# Patient Record
Sex: Female | Born: 1935 | Race: White | Hispanic: No | Smoking: Never smoker
Health system: Southern US, Community
[De-identification: ages and names within clinical notes are randomized; demographics above are authoritative.]

## PROBLEM LIST (undated history)

## (undated) DIAGNOSIS — M545 Low back pain, unspecified: Secondary | ICD-10-CM

## (undated) DIAGNOSIS — I639 Cerebral infarction, unspecified: Secondary | ICD-10-CM

## (undated) DIAGNOSIS — E782 Mixed hyperlipidemia: Secondary | ICD-10-CM

## (undated) DIAGNOSIS — E034 Atrophy of thyroid (acquired): Secondary | ICD-10-CM

## (undated) DIAGNOSIS — M1990 Unspecified osteoarthritis, unspecified site: Secondary | ICD-10-CM

## (undated) DIAGNOSIS — H47012 Ischemic optic neuropathy, left eye: Secondary | ICD-10-CM

## (undated) DIAGNOSIS — Z85828 Personal history of other malignant neoplasm of skin: Secondary | ICD-10-CM

## (undated) DIAGNOSIS — K219 Gastro-esophageal reflux disease without esophagitis: Secondary | ICD-10-CM

## (undated) DIAGNOSIS — E559 Vitamin D deficiency, unspecified: Secondary | ICD-10-CM

## (undated) DIAGNOSIS — I10 Essential (primary) hypertension: Secondary | ICD-10-CM

## (undated) HISTORY — DX: Atrophy of thyroid (acquired): E03.4

## (undated) HISTORY — PX: SALPINGOOPHORECTOMY: SHX82

## (undated) HISTORY — DX: Gastro-esophageal reflux disease without esophagitis: K21.9

## (undated) HISTORY — DX: Mixed hyperlipidemia: E78.2

## (undated) HISTORY — DX: Ischemic optic neuropathy, left eye: H47.012

## (undated) HISTORY — PX: THYROIDECTOMY: SHX17

## (undated) HISTORY — DX: Cerebral infarction, unspecified: I63.9

## (undated) HISTORY — DX: Low back pain, unspecified: M54.50

## (undated) HISTORY — DX: Unspecified osteoarthritis, unspecified site: M19.90

## (undated) HISTORY — DX: Essential (primary) hypertension: I10

## (undated) HISTORY — PX: ABDOMINAL HYSTERECTOMY: SHX81

## (undated) HISTORY — DX: Vitamin D deficiency, unspecified: E55.9

## (undated) HISTORY — DX: Personal history of other malignant neoplasm of skin: Z85.828

---
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
## 1998-05-20 ENCOUNTER — Ambulatory Visit (HOSPITAL_COMMUNITY): Admission: RE | Admit: 1998-05-20 | Discharge: 1998-05-20 | Payer: Self-pay | Admitting: Oncology

## 2003-05-25 ENCOUNTER — Ambulatory Visit (HOSPITAL_COMMUNITY): Admission: RE | Admit: 2003-05-25 | Discharge: 2003-05-25 | Payer: Self-pay | Admitting: *Deleted

## 2003-05-25 ENCOUNTER — Encounter: Payer: Self-pay | Admitting: *Deleted

## 2004-12-26 ENCOUNTER — Observation Stay (HOSPITAL_COMMUNITY): Admission: RE | Admit: 2004-12-26 | Discharge: 2004-12-27 | Payer: Self-pay | Admitting: Surgery

## 2006-01-06 ENCOUNTER — Encounter: Payer: Self-pay | Admitting: Oncology

## 2013-09-21 DIAGNOSIS — M5412 Radiculopathy, cervical region: Secondary | ICD-10-CM | POA: Diagnosis not present

## 2013-09-21 DIAGNOSIS — M67919 Unspecified disorder of synovium and tendon, unspecified shoulder: Secondary | ICD-10-CM | POA: Diagnosis not present

## 2013-09-21 DIAGNOSIS — M25519 Pain in unspecified shoulder: Secondary | ICD-10-CM | POA: Diagnosis not present

## 2013-10-02 DIAGNOSIS — H26499 Other secondary cataract, unspecified eye: Secondary | ICD-10-CM | POA: Diagnosis not present

## 2013-10-05 DIAGNOSIS — I889 Nonspecific lymphadenitis, unspecified: Secondary | ICD-10-CM | POA: Diagnosis not present

## 2013-10-11 DIAGNOSIS — R220 Localized swelling, mass and lump, head: Secondary | ICD-10-CM | POA: Diagnosis not present

## 2013-10-11 DIAGNOSIS — R221 Localized swelling, mass and lump, neck: Secondary | ICD-10-CM | POA: Diagnosis not present

## 2013-10-11 DIAGNOSIS — I889 Nonspecific lymphadenitis, unspecified: Secondary | ICD-10-CM | POA: Diagnosis not present

## 2013-12-06 LAB — HM DEXA SCAN

## 2013-12-13 DIAGNOSIS — R059 Cough, unspecified: Secondary | ICD-10-CM | POA: Diagnosis not present

## 2013-12-13 DIAGNOSIS — E038 Other specified hypothyroidism: Secondary | ICD-10-CM | POA: Diagnosis not present

## 2013-12-13 DIAGNOSIS — E7800 Pure hypercholesterolemia, unspecified: Secondary | ICD-10-CM | POA: Diagnosis not present

## 2013-12-13 DIAGNOSIS — R05 Cough: Secondary | ICD-10-CM | POA: Diagnosis not present

## 2013-12-13 DIAGNOSIS — E782 Mixed hyperlipidemia: Secondary | ICD-10-CM | POA: Diagnosis not present

## 2013-12-13 DIAGNOSIS — Z1331 Encounter for screening for depression: Secondary | ICD-10-CM | POA: Diagnosis not present

## 2013-12-13 DIAGNOSIS — I10 Essential (primary) hypertension: Secondary | ICD-10-CM | POA: Diagnosis not present

## 2013-12-13 DIAGNOSIS — Z1239 Encounter for other screening for malignant neoplasm of breast: Secondary | ICD-10-CM | POA: Diagnosis not present

## 2013-12-13 DIAGNOSIS — M818 Other osteoporosis without current pathological fracture: Secondary | ICD-10-CM | POA: Diagnosis not present

## 2013-12-25 DIAGNOSIS — M810 Age-related osteoporosis without current pathological fracture: Secondary | ICD-10-CM | POA: Diagnosis not present

## 2013-12-25 DIAGNOSIS — M818 Other osteoporosis without current pathological fracture: Secondary | ICD-10-CM | POA: Diagnosis not present

## 2013-12-25 DIAGNOSIS — Z1231 Encounter for screening mammogram for malignant neoplasm of breast: Secondary | ICD-10-CM | POA: Diagnosis not present

## 2014-01-09 DIAGNOSIS — L570 Actinic keratosis: Secondary | ICD-10-CM | POA: Diagnosis not present

## 2014-01-09 DIAGNOSIS — L819 Disorder of pigmentation, unspecified: Secondary | ICD-10-CM | POA: Diagnosis not present

## 2014-01-09 DIAGNOSIS — L723 Sebaceous cyst: Secondary | ICD-10-CM | POA: Diagnosis not present

## 2014-02-12 DIAGNOSIS — Z7902 Long term (current) use of antithrombotics/antiplatelets: Secondary | ICD-10-CM | POA: Diagnosis not present

## 2014-02-12 DIAGNOSIS — J440 Chronic obstructive pulmonary disease with acute lower respiratory infection: Secondary | ICD-10-CM | POA: Diagnosis not present

## 2014-02-12 DIAGNOSIS — R5383 Other fatigue: Secondary | ICD-10-CM | POA: Diagnosis not present

## 2014-02-12 DIAGNOSIS — Z8673 Personal history of transient ischemic attack (TIA), and cerebral infarction without residual deficits: Secondary | ICD-10-CM | POA: Diagnosis not present

## 2014-02-12 DIAGNOSIS — R5381 Other malaise: Secondary | ICD-10-CM | POA: Diagnosis not present

## 2014-02-12 DIAGNOSIS — J449 Chronic obstructive pulmonary disease, unspecified: Secondary | ICD-10-CM | POA: Diagnosis not present

## 2014-02-12 DIAGNOSIS — R002 Palpitations: Secondary | ICD-10-CM | POA: Diagnosis not present

## 2014-02-12 DIAGNOSIS — J209 Acute bronchitis, unspecified: Secondary | ICD-10-CM | POA: Diagnosis not present

## 2014-02-16 DIAGNOSIS — J209 Acute bronchitis, unspecified: Secondary | ICD-10-CM | POA: Diagnosis not present

## 2014-03-14 DIAGNOSIS — K591 Functional diarrhea: Secondary | ICD-10-CM | POA: Diagnosis not present

## 2014-03-14 DIAGNOSIS — R1310 Dysphagia, unspecified: Secondary | ICD-10-CM | POA: Diagnosis not present

## 2014-03-14 DIAGNOSIS — K644 Residual hemorrhoidal skin tags: Secondary | ICD-10-CM | POA: Diagnosis not present

## 2014-03-14 DIAGNOSIS — K2100 Gastro-esophageal reflux disease with esophagitis, without bleeding: Secondary | ICD-10-CM | POA: Diagnosis not present

## 2014-03-20 DIAGNOSIS — E559 Vitamin D deficiency, unspecified: Secondary | ICD-10-CM | POA: Diagnosis not present

## 2014-03-20 DIAGNOSIS — K219 Gastro-esophageal reflux disease without esophagitis: Secondary | ICD-10-CM | POA: Diagnosis not present

## 2014-03-20 DIAGNOSIS — E7800 Pure hypercholesterolemia, unspecified: Secondary | ICD-10-CM | POA: Diagnosis not present

## 2014-03-20 DIAGNOSIS — I6990 Unspecified sequelae of unspecified cerebrovascular disease: Secondary | ICD-10-CM | POA: Diagnosis not present

## 2014-03-20 DIAGNOSIS — Z79899 Other long term (current) drug therapy: Secondary | ICD-10-CM | POA: Diagnosis not present

## 2014-03-20 DIAGNOSIS — I10 Essential (primary) hypertension: Secondary | ICD-10-CM | POA: Diagnosis not present

## 2014-03-20 DIAGNOSIS — E038 Other specified hypothyroidism: Secondary | ICD-10-CM | POA: Diagnosis not present

## 2014-04-03 DIAGNOSIS — H4720 Unspecified optic atrophy: Secondary | ICD-10-CM | POA: Diagnosis not present

## 2014-04-04 DIAGNOSIS — K591 Functional diarrhea: Secondary | ICD-10-CM | POA: Diagnosis not present

## 2014-04-04 DIAGNOSIS — K2100 Gastro-esophageal reflux disease with esophagitis, without bleeding: Secondary | ICD-10-CM | POA: Diagnosis not present

## 2014-04-04 DIAGNOSIS — K645 Perianal venous thrombosis: Secondary | ICD-10-CM | POA: Diagnosis not present

## 2014-04-04 DIAGNOSIS — R1310 Dysphagia, unspecified: Secondary | ICD-10-CM | POA: Diagnosis not present

## 2014-04-17 DIAGNOSIS — R002 Palpitations: Secondary | ICD-10-CM | POA: Diagnosis not present

## 2014-04-17 DIAGNOSIS — I498 Other specified cardiac arrhythmias: Secondary | ICD-10-CM | POA: Diagnosis not present

## 2014-04-17 DIAGNOSIS — R079 Chest pain, unspecified: Secondary | ICD-10-CM | POA: Diagnosis not present

## 2014-04-17 DIAGNOSIS — R0789 Other chest pain: Secondary | ICD-10-CM | POA: Diagnosis not present

## 2014-06-07 DIAGNOSIS — J0100 Acute maxillary sinusitis, unspecified: Secondary | ICD-10-CM | POA: Diagnosis not present

## 2014-06-11 DIAGNOSIS — I493 Ventricular premature depolarization: Secondary | ICD-10-CM | POA: Diagnosis not present

## 2014-06-14 DIAGNOSIS — R002 Palpitations: Secondary | ICD-10-CM | POA: Diagnosis not present

## 2014-07-10 DIAGNOSIS — R194 Change in bowel habit: Secondary | ICD-10-CM | POA: Diagnosis not present

## 2014-07-10 DIAGNOSIS — I10 Essential (primary) hypertension: Secondary | ICD-10-CM | POA: Diagnosis not present

## 2014-07-10 DIAGNOSIS — R05 Cough: Secondary | ICD-10-CM | POA: Diagnosis not present

## 2014-07-10 DIAGNOSIS — R10814 Left lower quadrant abdominal tenderness: Secondary | ICD-10-CM | POA: Diagnosis not present

## 2014-07-10 DIAGNOSIS — Z23 Encounter for immunization: Secondary | ICD-10-CM | POA: Diagnosis not present

## 2014-07-10 DIAGNOSIS — J028 Acute pharyngitis due to other specified organisms: Secondary | ICD-10-CM | POA: Diagnosis not present

## 2014-07-16 DIAGNOSIS — M7552 Bursitis of left shoulder: Secondary | ICD-10-CM | POA: Diagnosis not present

## 2014-08-09 DIAGNOSIS — K210 Gastro-esophageal reflux disease with esophagitis: Secondary | ICD-10-CM | POA: Diagnosis not present

## 2014-08-09 DIAGNOSIS — K219 Gastro-esophageal reflux disease without esophagitis: Secondary | ICD-10-CM | POA: Diagnosis not present

## 2014-08-09 DIAGNOSIS — K591 Functional diarrhea: Secondary | ICD-10-CM | POA: Diagnosis not present

## 2014-08-09 DIAGNOSIS — R1310 Dysphagia, unspecified: Secondary | ICD-10-CM | POA: Diagnosis not present

## 2014-08-28 DIAGNOSIS — K5730 Diverticulosis of large intestine without perforation or abscess without bleeding: Secondary | ICD-10-CM | POA: Diagnosis not present

## 2014-08-28 DIAGNOSIS — Z91018 Allergy to other foods: Secondary | ICD-10-CM | POA: Diagnosis not present

## 2014-08-28 DIAGNOSIS — R634 Abnormal weight loss: Secondary | ICD-10-CM | POA: Diagnosis not present

## 2014-08-28 DIAGNOSIS — D126 Benign neoplasm of colon, unspecified: Secondary | ICD-10-CM | POA: Diagnosis not present

## 2014-08-28 DIAGNOSIS — J439 Emphysema, unspecified: Secondary | ICD-10-CM | POA: Diagnosis not present

## 2014-08-28 DIAGNOSIS — Z79899 Other long term (current) drug therapy: Secondary | ICD-10-CM | POA: Diagnosis not present

## 2014-08-28 DIAGNOSIS — K449 Diaphragmatic hernia without obstruction or gangrene: Secondary | ICD-10-CM | POA: Diagnosis not present

## 2014-08-28 DIAGNOSIS — D124 Benign neoplasm of descending colon: Secondary | ICD-10-CM | POA: Diagnosis not present

## 2014-08-28 DIAGNOSIS — R1310 Dysphagia, unspecified: Secondary | ICD-10-CM | POA: Diagnosis not present

## 2014-08-28 DIAGNOSIS — K219 Gastro-esophageal reflux disease without esophagitis: Secondary | ICD-10-CM | POA: Diagnosis not present

## 2014-08-28 DIAGNOSIS — K644 Residual hemorrhoidal skin tags: Secondary | ICD-10-CM | POA: Diagnosis not present

## 2014-08-28 DIAGNOSIS — K222 Esophageal obstruction: Secondary | ICD-10-CM | POA: Diagnosis not present

## 2014-08-28 DIAGNOSIS — K228 Other specified diseases of esophagus: Secondary | ICD-10-CM | POA: Diagnosis not present

## 2014-08-28 DIAGNOSIS — D125 Benign neoplasm of sigmoid colon: Secondary | ICD-10-CM | POA: Diagnosis not present

## 2014-09-10 DIAGNOSIS — H9201 Otalgia, right ear: Secondary | ICD-10-CM | POA: Diagnosis not present

## 2014-09-10 DIAGNOSIS — M25473 Effusion, unspecified ankle: Secondary | ICD-10-CM | POA: Diagnosis not present

## 2014-09-10 DIAGNOSIS — M25476 Effusion, unspecified foot: Secondary | ICD-10-CM | POA: Diagnosis not present

## 2014-09-10 DIAGNOSIS — E780 Pure hypercholesterolemia: Secondary | ICD-10-CM | POA: Diagnosis not present

## 2014-09-10 DIAGNOSIS — I10 Essential (primary) hypertension: Secondary | ICD-10-CM | POA: Diagnosis not present

## 2014-10-09 DIAGNOSIS — H4720 Unspecified optic atrophy: Secondary | ICD-10-CM | POA: Diagnosis not present

## 2014-10-10 DIAGNOSIS — R05 Cough: Secondary | ICD-10-CM | POA: Diagnosis not present

## 2014-10-10 DIAGNOSIS — K219 Gastro-esophageal reflux disease without esophagitis: Secondary | ICD-10-CM | POA: Diagnosis not present

## 2014-10-10 DIAGNOSIS — I10 Essential (primary) hypertension: Secondary | ICD-10-CM | POA: Diagnosis not present

## 2014-10-10 DIAGNOSIS — E559 Vitamin D deficiency, unspecified: Secondary | ICD-10-CM | POA: Diagnosis not present

## 2014-10-10 DIAGNOSIS — I6930 Unspecified sequelae of cerebral infarction: Secondary | ICD-10-CM | POA: Diagnosis not present

## 2014-10-10 DIAGNOSIS — E038 Other specified hypothyroidism: Secondary | ICD-10-CM | POA: Diagnosis not present

## 2014-10-10 DIAGNOSIS — E780 Pure hypercholesterolemia: Secondary | ICD-10-CM | POA: Diagnosis not present

## 2014-10-10 DIAGNOSIS — E034 Atrophy of thyroid (acquired): Secondary | ICD-10-CM | POA: Diagnosis not present

## 2014-10-15 DIAGNOSIS — R1310 Dysphagia, unspecified: Secondary | ICD-10-CM | POA: Diagnosis not present

## 2014-10-15 DIAGNOSIS — D126 Benign neoplasm of colon, unspecified: Secondary | ICD-10-CM | POA: Diagnosis not present

## 2014-10-15 DIAGNOSIS — K219 Gastro-esophageal reflux disease without esophagitis: Secondary | ICD-10-CM | POA: Diagnosis not present

## 2014-11-08 DIAGNOSIS — L570 Actinic keratosis: Secondary | ICD-10-CM | POA: Diagnosis not present

## 2014-11-08 DIAGNOSIS — L821 Other seborrheic keratosis: Secondary | ICD-10-CM | POA: Diagnosis not present

## 2015-01-14 DIAGNOSIS — Z1231 Encounter for screening mammogram for malignant neoplasm of breast: Secondary | ICD-10-CM | POA: Diagnosis not present

## 2015-01-15 DIAGNOSIS — E559 Vitamin D deficiency, unspecified: Secondary | ICD-10-CM | POA: Diagnosis not present

## 2015-01-15 DIAGNOSIS — I6930 Unspecified sequelae of cerebral infarction: Secondary | ICD-10-CM | POA: Diagnosis not present

## 2015-01-15 DIAGNOSIS — I10 Essential (primary) hypertension: Secondary | ICD-10-CM | POA: Diagnosis not present

## 2015-01-15 DIAGNOSIS — E780 Pure hypercholesterolemia: Secondary | ICD-10-CM | POA: Diagnosis not present

## 2015-01-15 DIAGNOSIS — R05 Cough: Secondary | ICD-10-CM | POA: Diagnosis not present

## 2015-01-15 DIAGNOSIS — K219 Gastro-esophageal reflux disease without esophagitis: Secondary | ICD-10-CM | POA: Diagnosis not present

## 2015-01-18 DIAGNOSIS — B029 Zoster without complications: Secondary | ICD-10-CM | POA: Diagnosis not present

## 2015-03-14 DIAGNOSIS — M79672 Pain in left foot: Secondary | ICD-10-CM | POA: Diagnosis not present

## 2015-04-04 DIAGNOSIS — M7552 Bursitis of left shoulder: Secondary | ICD-10-CM | POA: Diagnosis not present

## 2015-04-09 DIAGNOSIS — H4720 Unspecified optic atrophy: Secondary | ICD-10-CM | POA: Diagnosis not present

## 2015-04-15 DIAGNOSIS — M7522 Bicipital tendinitis, left shoulder: Secondary | ICD-10-CM | POA: Diagnosis not present

## 2015-04-15 DIAGNOSIS — M7552 Bursitis of left shoulder: Secondary | ICD-10-CM | POA: Diagnosis not present

## 2015-04-25 DIAGNOSIS — Z23 Encounter for immunization: Secondary | ICD-10-CM | POA: Diagnosis not present

## 2015-04-25 DIAGNOSIS — E780 Pure hypercholesterolemia: Secondary | ICD-10-CM | POA: Diagnosis not present

## 2015-04-25 DIAGNOSIS — I10 Essential (primary) hypertension: Secondary | ICD-10-CM | POA: Diagnosis not present

## 2015-04-25 DIAGNOSIS — I6930 Unspecified sequelae of cerebral infarction: Secondary | ICD-10-CM | POA: Diagnosis not present

## 2015-04-25 DIAGNOSIS — K219 Gastro-esophageal reflux disease without esophagitis: Secondary | ICD-10-CM | POA: Diagnosis not present

## 2015-04-25 DIAGNOSIS — E038 Other specified hypothyroidism: Secondary | ICD-10-CM | POA: Diagnosis not present

## 2015-05-13 DIAGNOSIS — S8001XA Contusion of right knee, initial encounter: Secondary | ICD-10-CM | POA: Diagnosis not present

## 2015-05-23 DIAGNOSIS — S51811A Laceration without foreign body of right forearm, initial encounter: Secondary | ICD-10-CM | POA: Diagnosis not present

## 2015-05-23 DIAGNOSIS — W2203XA Walked into furniture, initial encounter: Secondary | ICD-10-CM | POA: Diagnosis not present

## 2015-05-30 DIAGNOSIS — Z23 Encounter for immunization: Secondary | ICD-10-CM | POA: Diagnosis not present

## 2015-05-30 DIAGNOSIS — Z0000 Encounter for general adult medical examination without abnormal findings: Secondary | ICD-10-CM | POA: Diagnosis not present

## 2015-07-09 DIAGNOSIS — L578 Other skin changes due to chronic exposure to nonionizing radiation: Secondary | ICD-10-CM | POA: Diagnosis not present

## 2015-07-09 DIAGNOSIS — L821 Other seborrheic keratosis: Secondary | ICD-10-CM | POA: Diagnosis not present

## 2015-07-09 DIAGNOSIS — L570 Actinic keratosis: Secondary | ICD-10-CM | POA: Diagnosis not present

## 2015-07-09 DIAGNOSIS — L820 Inflamed seborrheic keratosis: Secondary | ICD-10-CM | POA: Diagnosis not present

## 2015-08-05 DIAGNOSIS — E782 Mixed hyperlipidemia: Secondary | ICD-10-CM | POA: Diagnosis not present

## 2015-08-05 DIAGNOSIS — E784 Other hyperlipidemia: Secondary | ICD-10-CM | POA: Diagnosis not present

## 2015-08-05 DIAGNOSIS — E034 Atrophy of thyroid (acquired): Secondary | ICD-10-CM | POA: Diagnosis not present

## 2015-08-05 DIAGNOSIS — R10814 Left lower quadrant abdominal tenderness: Secondary | ICD-10-CM | POA: Diagnosis not present

## 2015-08-05 DIAGNOSIS — I10 Essential (primary) hypertension: Secondary | ICD-10-CM | POA: Diagnosis not present

## 2015-08-05 DIAGNOSIS — R002 Palpitations: Secondary | ICD-10-CM | POA: Diagnosis not present

## 2015-08-05 DIAGNOSIS — K219 Gastro-esophageal reflux disease without esophagitis: Secondary | ICD-10-CM | POA: Diagnosis not present

## 2015-08-05 DIAGNOSIS — R05 Cough: Secondary | ICD-10-CM | POA: Diagnosis not present

## 2015-08-05 DIAGNOSIS — I6930 Unspecified sequelae of cerebral infarction: Secondary | ICD-10-CM | POA: Diagnosis not present

## 2015-08-14 DIAGNOSIS — M7522 Bicipital tendinitis, left shoulder: Secondary | ICD-10-CM | POA: Diagnosis not present

## 2015-08-15 DIAGNOSIS — R1032 Left lower quadrant pain: Secondary | ICD-10-CM | POA: Diagnosis not present

## 2015-08-15 DIAGNOSIS — K5730 Diverticulosis of large intestine without perforation or abscess without bleeding: Secondary | ICD-10-CM | POA: Diagnosis not present

## 2015-08-15 DIAGNOSIS — I517 Cardiomegaly: Secondary | ICD-10-CM | POA: Diagnosis not present

## 2015-08-26 DIAGNOSIS — M19012 Primary osteoarthritis, left shoulder: Secondary | ICD-10-CM | POA: Diagnosis not present

## 2015-08-26 DIAGNOSIS — M25512 Pain in left shoulder: Secondary | ICD-10-CM | POA: Diagnosis not present

## 2015-08-26 DIAGNOSIS — M75102 Unspecified rotator cuff tear or rupture of left shoulder, not specified as traumatic: Secondary | ICD-10-CM | POA: Diagnosis not present

## 2015-08-26 DIAGNOSIS — M7522 Bicipital tendinitis, left shoulder: Secondary | ICD-10-CM | POA: Diagnosis not present

## 2015-08-26 DIAGNOSIS — M25412 Effusion, left shoulder: Secondary | ICD-10-CM | POA: Diagnosis not present

## 2015-08-28 DIAGNOSIS — M7522 Bicipital tendinitis, left shoulder: Secondary | ICD-10-CM | POA: Diagnosis not present

## 2015-08-28 DIAGNOSIS — M75122 Complete rotator cuff tear or rupture of left shoulder, not specified as traumatic: Secondary | ICD-10-CM | POA: Diagnosis not present

## 2015-09-04 DIAGNOSIS — J45991 Cough variant asthma: Secondary | ICD-10-CM | POA: Diagnosis not present

## 2015-09-24 DIAGNOSIS — Z01810 Encounter for preprocedural cardiovascular examination: Secondary | ICD-10-CM | POA: Diagnosis not present

## 2015-09-24 DIAGNOSIS — M25512 Pain in left shoulder: Secondary | ICD-10-CM | POA: Diagnosis not present

## 2015-09-24 DIAGNOSIS — J45991 Cough variant asthma: Secondary | ICD-10-CM | POA: Diagnosis not present

## 2015-09-24 DIAGNOSIS — R05 Cough: Secondary | ICD-10-CM | POA: Diagnosis not present

## 2015-10-08 DIAGNOSIS — J45991 Cough variant asthma: Secondary | ICD-10-CM | POA: Diagnosis not present

## 2015-10-09 DIAGNOSIS — H4720 Unspecified optic atrophy: Secondary | ICD-10-CM | POA: Diagnosis not present

## 2015-11-12 DIAGNOSIS — L570 Actinic keratosis: Secondary | ICD-10-CM | POA: Diagnosis not present

## 2015-11-12 DIAGNOSIS — L821 Other seborrheic keratosis: Secondary | ICD-10-CM | POA: Diagnosis not present

## 2015-12-10 DIAGNOSIS — I10 Essential (primary) hypertension: Secondary | ICD-10-CM | POA: Diagnosis not present

## 2015-12-10 DIAGNOSIS — E038 Other specified hypothyroidism: Secondary | ICD-10-CM | POA: Diagnosis not present

## 2015-12-10 DIAGNOSIS — E782 Mixed hyperlipidemia: Secondary | ICD-10-CM | POA: Diagnosis not present

## 2015-12-10 DIAGNOSIS — H47012 Ischemic optic neuropathy, left eye: Secondary | ICD-10-CM | POA: Diagnosis not present

## 2016-01-16 DIAGNOSIS — Z1231 Encounter for screening mammogram for malignant neoplasm of breast: Secondary | ICD-10-CM | POA: Diagnosis not present

## 2016-02-04 DIAGNOSIS — J0100 Acute maxillary sinusitis, unspecified: Secondary | ICD-10-CM | POA: Diagnosis not present

## 2016-02-04 DIAGNOSIS — J029 Acute pharyngitis, unspecified: Secondary | ICD-10-CM | POA: Diagnosis not present

## 2016-03-31 DIAGNOSIS — E782 Mixed hyperlipidemia: Secondary | ICD-10-CM | POA: Diagnosis not present

## 2016-03-31 DIAGNOSIS — H47012 Ischemic optic neuropathy, left eye: Secondary | ICD-10-CM | POA: Diagnosis not present

## 2016-03-31 DIAGNOSIS — I10 Essential (primary) hypertension: Secondary | ICD-10-CM | POA: Diagnosis not present

## 2016-04-07 DIAGNOSIS — H4720 Unspecified optic atrophy: Secondary | ICD-10-CM | POA: Diagnosis not present

## 2016-07-06 DIAGNOSIS — H47012 Ischemic optic neuropathy, left eye: Secondary | ICD-10-CM | POA: Diagnosis not present

## 2016-07-06 DIAGNOSIS — E782 Mixed hyperlipidemia: Secondary | ICD-10-CM | POA: Diagnosis not present

## 2016-07-06 DIAGNOSIS — Z23 Encounter for immunization: Secondary | ICD-10-CM | POA: Diagnosis not present

## 2016-07-06 DIAGNOSIS — R05 Cough: Secondary | ICD-10-CM | POA: Diagnosis not present

## 2016-07-06 DIAGNOSIS — I10 Essential (primary) hypertension: Secondary | ICD-10-CM | POA: Diagnosis not present

## 2016-07-06 DIAGNOSIS — E038 Other specified hypothyroidism: Secondary | ICD-10-CM | POA: Diagnosis not present

## 2016-07-06 DIAGNOSIS — Z1389 Encounter for screening for other disorder: Secondary | ICD-10-CM | POA: Diagnosis not present

## 2016-07-24 DIAGNOSIS — H538 Other visual disturbances: Secondary | ICD-10-CM | POA: Diagnosis not present

## 2016-07-24 DIAGNOSIS — R51 Headache: Secondary | ICD-10-CM | POA: Diagnosis not present

## 2016-08-13 DIAGNOSIS — I10 Essential (primary) hypertension: Secondary | ICD-10-CM | POA: Diagnosis not present

## 2016-08-13 DIAGNOSIS — M79601 Pain in right arm: Secondary | ICD-10-CM | POA: Diagnosis not present

## 2016-08-13 DIAGNOSIS — Z6827 Body mass index (BMI) 27.0-27.9, adult: Secondary | ICD-10-CM | POA: Diagnosis not present

## 2016-08-13 DIAGNOSIS — R002 Palpitations: Secondary | ICD-10-CM | POA: Diagnosis not present

## 2016-08-13 DIAGNOSIS — M79602 Pain in left arm: Secondary | ICD-10-CM | POA: Diagnosis not present

## 2016-08-18 DIAGNOSIS — H4720 Unspecified optic atrophy: Secondary | ICD-10-CM | POA: Diagnosis not present

## 2016-08-18 DIAGNOSIS — H26493 Other secondary cataract, bilateral: Secondary | ICD-10-CM | POA: Diagnosis not present

## 2016-08-18 DIAGNOSIS — H543 Unqualified visual loss, both eyes: Secondary | ICD-10-CM | POA: Diagnosis not present

## 2016-09-17 DIAGNOSIS — I10 Essential (primary) hypertension: Secondary | ICD-10-CM | POA: Diagnosis not present

## 2016-09-29 DIAGNOSIS — L299 Pruritus, unspecified: Secondary | ICD-10-CM | POA: Diagnosis not present

## 2016-09-29 DIAGNOSIS — L300 Nummular dermatitis: Secondary | ICD-10-CM | POA: Diagnosis not present

## 2016-09-29 DIAGNOSIS — L728 Other follicular cysts of the skin and subcutaneous tissue: Secondary | ICD-10-CM | POA: Diagnosis not present

## 2016-09-29 DIAGNOSIS — L570 Actinic keratosis: Secondary | ICD-10-CM | POA: Diagnosis not present

## 2016-09-29 DIAGNOSIS — L821 Other seborrheic keratosis: Secondary | ICD-10-CM | POA: Diagnosis not present

## 2016-10-14 DIAGNOSIS — E038 Other specified hypothyroidism: Secondary | ICD-10-CM | POA: Diagnosis not present

## 2016-10-14 DIAGNOSIS — I10 Essential (primary) hypertension: Secondary | ICD-10-CM | POA: Diagnosis not present

## 2016-10-14 DIAGNOSIS — R10811 Right upper quadrant abdominal tenderness: Secondary | ICD-10-CM | POA: Diagnosis not present

## 2016-10-14 DIAGNOSIS — H47012 Ischemic optic neuropathy, left eye: Secondary | ICD-10-CM | POA: Diagnosis not present

## 2016-10-14 DIAGNOSIS — E782 Mixed hyperlipidemia: Secondary | ICD-10-CM | POA: Diagnosis not present

## 2016-10-14 DIAGNOSIS — R05 Cough: Secondary | ICD-10-CM | POA: Diagnosis not present

## 2016-10-16 DIAGNOSIS — R10811 Right upper quadrant abdominal tenderness: Secondary | ICD-10-CM | POA: Diagnosis not present

## 2016-10-16 DIAGNOSIS — R1011 Right upper quadrant pain: Secondary | ICD-10-CM | POA: Diagnosis not present

## 2016-10-19 DIAGNOSIS — H4720 Unspecified optic atrophy: Secondary | ICD-10-CM | POA: Diagnosis not present

## 2016-11-05 DIAGNOSIS — R10811 Right upper quadrant abdominal tenderness: Secondary | ICD-10-CM | POA: Diagnosis not present

## 2016-11-05 DIAGNOSIS — R1011 Right upper quadrant pain: Secondary | ICD-10-CM | POA: Diagnosis not present

## 2016-11-11 DIAGNOSIS — R10814 Left lower quadrant abdominal tenderness: Secondary | ICD-10-CM | POA: Diagnosis not present

## 2016-11-11 DIAGNOSIS — R10811 Right upper quadrant abdominal tenderness: Secondary | ICD-10-CM | POA: Diagnosis not present

## 2016-11-11 DIAGNOSIS — R109 Unspecified abdominal pain: Secondary | ICD-10-CM | POA: Diagnosis not present

## 2016-11-11 DIAGNOSIS — Z1239 Encounter for other screening for malignant neoplasm of breast: Secondary | ICD-10-CM | POA: Diagnosis not present

## 2016-11-11 DIAGNOSIS — K5730 Diverticulosis of large intestine without perforation or abscess without bleeding: Secondary | ICD-10-CM | POA: Diagnosis not present

## 2016-11-11 DIAGNOSIS — D4861 Neoplasm of uncertain behavior of right breast: Secondary | ICD-10-CM | POA: Diagnosis not present

## 2016-11-18 DIAGNOSIS — D4861 Neoplasm of uncertain behavior of right breast: Secondary | ICD-10-CM | POA: Diagnosis not present

## 2016-11-18 DIAGNOSIS — R0789 Other chest pain: Secondary | ICD-10-CM | POA: Diagnosis not present

## 2016-11-18 DIAGNOSIS — D493 Neoplasm of unspecified behavior of breast: Secondary | ICD-10-CM | POA: Diagnosis not present

## 2016-11-19 DIAGNOSIS — R079 Chest pain, unspecified: Secondary | ICD-10-CM | POA: Diagnosis not present

## 2016-11-19 DIAGNOSIS — N6310 Unspecified lump in the right breast, unspecified quadrant: Secondary | ICD-10-CM | POA: Diagnosis not present

## 2016-11-19 DIAGNOSIS — R0789 Other chest pain: Secondary | ICD-10-CM | POA: Diagnosis not present

## 2016-11-23 DIAGNOSIS — R928 Other abnormal and inconclusive findings on diagnostic imaging of breast: Secondary | ICD-10-CM | POA: Diagnosis not present

## 2016-11-23 DIAGNOSIS — N6314 Unspecified lump in the right breast, lower inner quadrant: Secondary | ICD-10-CM | POA: Diagnosis not present

## 2016-11-23 DIAGNOSIS — N6489 Other specified disorders of breast: Secondary | ICD-10-CM | POA: Diagnosis not present

## 2016-11-30 DIAGNOSIS — R1011 Right upper quadrant pain: Secondary | ICD-10-CM | POA: Diagnosis not present

## 2017-01-16 DIAGNOSIS — L814 Other melanin hyperpigmentation: Secondary | ICD-10-CM | POA: Diagnosis not present

## 2017-01-16 DIAGNOSIS — L821 Other seborrheic keratosis: Secondary | ICD-10-CM | POA: Diagnosis not present

## 2017-01-16 DIAGNOSIS — L570 Actinic keratosis: Secondary | ICD-10-CM | POA: Diagnosis not present

## 2017-02-05 DIAGNOSIS — J029 Acute pharyngitis, unspecified: Secondary | ICD-10-CM | POA: Diagnosis not present

## 2017-02-05 DIAGNOSIS — J309 Allergic rhinitis, unspecified: Secondary | ICD-10-CM | POA: Diagnosis not present

## 2017-02-23 DIAGNOSIS — K219 Gastro-esophageal reflux disease without esophagitis: Secondary | ICD-10-CM | POA: Diagnosis not present

## 2017-02-23 DIAGNOSIS — E034 Atrophy of thyroid (acquired): Secondary | ICD-10-CM | POA: Diagnosis not present

## 2017-02-23 DIAGNOSIS — I10 Essential (primary) hypertension: Secondary | ICD-10-CM | POA: Diagnosis not present

## 2017-02-23 DIAGNOSIS — E559 Vitamin D deficiency, unspecified: Secondary | ICD-10-CM | POA: Diagnosis not present

## 2017-02-23 DIAGNOSIS — E782 Mixed hyperlipidemia: Secondary | ICD-10-CM | POA: Diagnosis not present

## 2017-04-13 DIAGNOSIS — L570 Actinic keratosis: Secondary | ICD-10-CM | POA: Diagnosis not present

## 2017-04-13 DIAGNOSIS — L821 Other seborrheic keratosis: Secondary | ICD-10-CM | POA: Diagnosis not present

## 2017-05-12 DIAGNOSIS — H4720 Unspecified optic atrophy: Secondary | ICD-10-CM | POA: Diagnosis not present

## 2017-05-26 DIAGNOSIS — E034 Atrophy of thyroid (acquired): Secondary | ICD-10-CM | POA: Diagnosis not present

## 2017-05-26 DIAGNOSIS — Z23 Encounter for immunization: Secondary | ICD-10-CM | POA: Diagnosis not present

## 2017-05-26 DIAGNOSIS — E782 Mixed hyperlipidemia: Secondary | ICD-10-CM | POA: Diagnosis not present

## 2017-05-26 DIAGNOSIS — K219 Gastro-esophageal reflux disease without esophagitis: Secondary | ICD-10-CM | POA: Diagnosis not present

## 2017-05-26 DIAGNOSIS — E559 Vitamin D deficiency, unspecified: Secondary | ICD-10-CM | POA: Diagnosis not present

## 2017-05-26 DIAGNOSIS — I10 Essential (primary) hypertension: Secondary | ICD-10-CM | POA: Diagnosis not present

## 2017-06-09 DIAGNOSIS — L570 Actinic keratosis: Secondary | ICD-10-CM | POA: Diagnosis not present

## 2017-06-09 DIAGNOSIS — L821 Other seborrheic keratosis: Secondary | ICD-10-CM | POA: Diagnosis not present

## 2017-07-07 DIAGNOSIS — J309 Allergic rhinitis, unspecified: Secondary | ICD-10-CM | POA: Diagnosis not present

## 2017-08-27 DIAGNOSIS — E782 Mixed hyperlipidemia: Secondary | ICD-10-CM | POA: Diagnosis not present

## 2017-08-27 DIAGNOSIS — E038 Other specified hypothyroidism: Secondary | ICD-10-CM | POA: Diagnosis not present

## 2017-08-27 DIAGNOSIS — I10 Essential (primary) hypertension: Secondary | ICD-10-CM | POA: Diagnosis not present

## 2017-08-27 DIAGNOSIS — E034 Atrophy of thyroid (acquired): Secondary | ICD-10-CM | POA: Diagnosis not present

## 2017-08-27 DIAGNOSIS — K219 Gastro-esophageal reflux disease without esophagitis: Secondary | ICD-10-CM | POA: Diagnosis not present

## 2017-08-27 DIAGNOSIS — E559 Vitamin D deficiency, unspecified: Secondary | ICD-10-CM | POA: Diagnosis not present

## 2017-09-01 DIAGNOSIS — J029 Acute pharyngitis, unspecified: Secondary | ICD-10-CM | POA: Diagnosis not present

## 2017-09-27 DIAGNOSIS — F411 Generalized anxiety disorder: Secondary | ICD-10-CM | POA: Diagnosis not present

## 2017-11-09 DIAGNOSIS — H4720 Unspecified optic atrophy: Secondary | ICD-10-CM | POA: Diagnosis not present

## 2017-11-09 DIAGNOSIS — H26493 Other secondary cataract, bilateral: Secondary | ICD-10-CM | POA: Diagnosis not present

## 2017-11-15 DIAGNOSIS — L820 Inflamed seborrheic keratosis: Secondary | ICD-10-CM | POA: Diagnosis not present

## 2017-11-15 DIAGNOSIS — L570 Actinic keratosis: Secondary | ICD-10-CM | POA: Diagnosis not present

## 2017-11-15 DIAGNOSIS — L578 Other skin changes due to chronic exposure to nonionizing radiation: Secondary | ICD-10-CM | POA: Diagnosis not present

## 2017-11-17 DIAGNOSIS — R221 Localized swelling, mass and lump, neck: Secondary | ICD-10-CM | POA: Diagnosis not present

## 2017-11-22 DIAGNOSIS — R221 Localized swelling, mass and lump, neck: Secondary | ICD-10-CM | POA: Diagnosis not present

## 2017-11-26 DIAGNOSIS — I10 Essential (primary) hypertension: Secondary | ICD-10-CM | POA: Diagnosis not present

## 2017-11-26 DIAGNOSIS — E034 Atrophy of thyroid (acquired): Secondary | ICD-10-CM | POA: Diagnosis not present

## 2017-11-26 DIAGNOSIS — Z6827 Body mass index (BMI) 27.0-27.9, adult: Secondary | ICD-10-CM | POA: Diagnosis not present

## 2017-11-26 DIAGNOSIS — K219 Gastro-esophageal reflux disease without esophagitis: Secondary | ICD-10-CM | POA: Diagnosis not present

## 2017-11-26 DIAGNOSIS — E782 Mixed hyperlipidemia: Secondary | ICD-10-CM | POA: Diagnosis not present

## 2017-11-26 DIAGNOSIS — E559 Vitamin D deficiency, unspecified: Secondary | ICD-10-CM | POA: Diagnosis not present

## 2017-11-26 DIAGNOSIS — M545 Low back pain: Secondary | ICD-10-CM | POA: Diagnosis not present

## 2017-12-30 DIAGNOSIS — M25552 Pain in left hip: Secondary | ICD-10-CM | POA: Diagnosis not present

## 2017-12-30 DIAGNOSIS — M7062 Trochanteric bursitis, left hip: Secondary | ICD-10-CM | POA: Diagnosis not present

## 2018-02-08 DIAGNOSIS — R05 Cough: Secondary | ICD-10-CM | POA: Diagnosis not present

## 2018-02-08 DIAGNOSIS — R1314 Dysphagia, pharyngoesophageal phase: Secondary | ICD-10-CM | POA: Diagnosis not present

## 2018-02-10 DIAGNOSIS — R0602 Shortness of breath: Secondary | ICD-10-CM | POA: Diagnosis not present

## 2018-02-10 DIAGNOSIS — R1314 Dysphagia, pharyngoesophageal phase: Secondary | ICD-10-CM | POA: Diagnosis not present

## 2018-02-10 DIAGNOSIS — R05 Cough: Secondary | ICD-10-CM | POA: Diagnosis not present

## 2018-02-21 DIAGNOSIS — M545 Low back pain: Secondary | ICD-10-CM | POA: Diagnosis not present

## 2018-02-21 DIAGNOSIS — I639 Cerebral infarction, unspecified: Secondary | ICD-10-CM | POA: Insufficient documentation

## 2018-03-04 DIAGNOSIS — J449 Chronic obstructive pulmonary disease, unspecified: Secondary | ICD-10-CM | POA: Diagnosis not present

## 2018-03-04 DIAGNOSIS — H538 Other visual disturbances: Secondary | ICD-10-CM | POA: Diagnosis not present

## 2018-03-04 DIAGNOSIS — R002 Palpitations: Secondary | ICD-10-CM | POA: Diagnosis not present

## 2018-03-04 DIAGNOSIS — I160 Hypertensive urgency: Secondary | ICD-10-CM | POA: Diagnosis not present

## 2018-03-04 DIAGNOSIS — R42 Dizziness and giddiness: Secondary | ICD-10-CM | POA: Diagnosis not present

## 2018-03-04 DIAGNOSIS — I10 Essential (primary) hypertension: Secondary | ICD-10-CM | POA: Diagnosis not present

## 2018-03-04 DIAGNOSIS — H748X3 Other specified disorders of middle ear and mastoid, bilateral: Secondary | ICD-10-CM | POA: Diagnosis not present

## 2018-03-14 DIAGNOSIS — H81393 Other peripheral vertigo, bilateral: Secondary | ICD-10-CM | POA: Diagnosis not present

## 2018-03-14 DIAGNOSIS — I10 Essential (primary) hypertension: Secondary | ICD-10-CM | POA: Diagnosis not present

## 2018-03-14 DIAGNOSIS — K219 Gastro-esophageal reflux disease without esophagitis: Secondary | ICD-10-CM | POA: Diagnosis not present

## 2018-03-14 DIAGNOSIS — E034 Atrophy of thyroid (acquired): Secondary | ICD-10-CM | POA: Diagnosis not present

## 2018-03-14 DIAGNOSIS — E782 Mixed hyperlipidemia: Secondary | ICD-10-CM | POA: Diagnosis not present

## 2018-03-14 DIAGNOSIS — E559 Vitamin D deficiency, unspecified: Secondary | ICD-10-CM | POA: Diagnosis not present

## 2018-03-14 DIAGNOSIS — M5432 Sciatica, left side: Secondary | ICD-10-CM | POA: Diagnosis not present

## 2018-03-14 DIAGNOSIS — M545 Low back pain: Secondary | ICD-10-CM | POA: Diagnosis not present

## 2018-03-14 DIAGNOSIS — Z6827 Body mass index (BMI) 27.0-27.9, adult: Secondary | ICD-10-CM | POA: Diagnosis not present

## 2018-03-15 DIAGNOSIS — M5136 Other intervertebral disc degeneration, lumbar region: Secondary | ICD-10-CM | POA: Insufficient documentation

## 2018-03-17 DIAGNOSIS — M9901 Segmental and somatic dysfunction of cervical region: Secondary | ICD-10-CM | POA: Diagnosis not present

## 2018-03-17 DIAGNOSIS — M542 Cervicalgia: Secondary | ICD-10-CM | POA: Diagnosis not present

## 2018-03-17 DIAGNOSIS — M4712 Other spondylosis with myelopathy, cervical region: Secondary | ICD-10-CM | POA: Diagnosis not present

## 2018-03-17 DIAGNOSIS — M9902 Segmental and somatic dysfunction of thoracic region: Secondary | ICD-10-CM | POA: Diagnosis not present

## 2018-03-21 DIAGNOSIS — K5730 Diverticulosis of large intestine without perforation or abscess without bleeding: Secondary | ICD-10-CM | POA: Diagnosis not present

## 2018-03-21 DIAGNOSIS — K219 Gastro-esophageal reflux disease without esophagitis: Secondary | ICD-10-CM | POA: Diagnosis not present

## 2018-03-21 DIAGNOSIS — R1310 Dysphagia, unspecified: Secondary | ICD-10-CM | POA: Diagnosis not present

## 2018-03-28 DIAGNOSIS — R233 Spontaneous ecchymoses: Secondary | ICD-10-CM | POA: Diagnosis not present

## 2018-03-28 DIAGNOSIS — L570 Actinic keratosis: Secondary | ICD-10-CM | POA: Diagnosis not present

## 2018-03-28 DIAGNOSIS — L578 Other skin changes due to chronic exposure to nonionizing radiation: Secondary | ICD-10-CM | POA: Diagnosis not present

## 2018-03-31 DIAGNOSIS — M542 Cervicalgia: Secondary | ICD-10-CM | POA: Diagnosis not present

## 2018-03-31 DIAGNOSIS — M4712 Other spondylosis with myelopathy, cervical region: Secondary | ICD-10-CM | POA: Diagnosis not present

## 2018-03-31 DIAGNOSIS — M9902 Segmental and somatic dysfunction of thoracic region: Secondary | ICD-10-CM | POA: Diagnosis not present

## 2018-03-31 DIAGNOSIS — M9901 Segmental and somatic dysfunction of cervical region: Secondary | ICD-10-CM | POA: Diagnosis not present

## 2018-04-05 DIAGNOSIS — Z8673 Personal history of transient ischemic attack (TIA), and cerebral infarction without residual deficits: Secondary | ICD-10-CM | POA: Diagnosis not present

## 2018-04-05 DIAGNOSIS — Z79899 Other long term (current) drug therapy: Secondary | ICD-10-CM | POA: Diagnosis not present

## 2018-04-05 DIAGNOSIS — Z702 Counseling related to sexual behavior and orientation of third party: Secondary | ICD-10-CM | POA: Diagnosis not present

## 2018-04-05 DIAGNOSIS — I10 Essential (primary) hypertension: Secondary | ICD-10-CM | POA: Diagnosis not present

## 2018-04-05 DIAGNOSIS — E079 Disorder of thyroid, unspecified: Secondary | ICD-10-CM | POA: Diagnosis not present

## 2018-04-05 DIAGNOSIS — M1990 Unspecified osteoarthritis, unspecified site: Secondary | ICD-10-CM | POA: Diagnosis not present

## 2018-04-05 DIAGNOSIS — R1310 Dysphagia, unspecified: Secondary | ICD-10-CM | POA: Diagnosis not present

## 2018-04-05 DIAGNOSIS — K222 Esophageal obstruction: Secondary | ICD-10-CM | POA: Diagnosis not present

## 2018-04-05 DIAGNOSIS — Z85828 Personal history of other malignant neoplasm of skin: Secondary | ICD-10-CM | POA: Diagnosis not present

## 2018-04-05 DIAGNOSIS — K219 Gastro-esophageal reflux disease without esophagitis: Secondary | ICD-10-CM | POA: Diagnosis not present

## 2018-04-05 DIAGNOSIS — Z8709 Personal history of other diseases of the respiratory system: Secondary | ICD-10-CM | POA: Diagnosis not present

## 2018-04-05 DIAGNOSIS — K449 Diaphragmatic hernia without obstruction or gangrene: Secondary | ICD-10-CM | POA: Diagnosis not present

## 2018-04-07 DIAGNOSIS — M5432 Sciatica, left side: Secondary | ICD-10-CM | POA: Diagnosis not present

## 2018-04-07 DIAGNOSIS — R600 Localized edema: Secondary | ICD-10-CM | POA: Diagnosis not present

## 2018-04-14 DIAGNOSIS — M9901 Segmental and somatic dysfunction of cervical region: Secondary | ICD-10-CM | POA: Diagnosis not present

## 2018-04-14 DIAGNOSIS — M4712 Other spondylosis with myelopathy, cervical region: Secondary | ICD-10-CM | POA: Diagnosis not present

## 2018-04-14 DIAGNOSIS — M9902 Segmental and somatic dysfunction of thoracic region: Secondary | ICD-10-CM | POA: Diagnosis not present

## 2018-04-14 DIAGNOSIS — M542 Cervicalgia: Secondary | ICD-10-CM | POA: Diagnosis not present

## 2018-04-20 DIAGNOSIS — M545 Low back pain: Secondary | ICD-10-CM | POA: Diagnosis not present

## 2018-04-25 DIAGNOSIS — M5136 Other intervertebral disc degeneration, lumbar region: Secondary | ICD-10-CM | POA: Diagnosis not present

## 2018-04-28 DIAGNOSIS — M4712 Other spondylosis with myelopathy, cervical region: Secondary | ICD-10-CM | POA: Diagnosis not present

## 2018-04-28 DIAGNOSIS — M9902 Segmental and somatic dysfunction of thoracic region: Secondary | ICD-10-CM | POA: Diagnosis not present

## 2018-04-28 DIAGNOSIS — M542 Cervicalgia: Secondary | ICD-10-CM | POA: Diagnosis not present

## 2018-04-28 DIAGNOSIS — M9901 Segmental and somatic dysfunction of cervical region: Secondary | ICD-10-CM | POA: Diagnosis not present

## 2018-05-03 DIAGNOSIS — M5432 Sciatica, left side: Secondary | ICD-10-CM | POA: Diagnosis not present

## 2018-05-03 DIAGNOSIS — M5442 Lumbago with sciatica, left side: Secondary | ICD-10-CM | POA: Diagnosis not present

## 2018-05-03 DIAGNOSIS — M5136 Other intervertebral disc degeneration, lumbar region: Secondary | ICD-10-CM | POA: Diagnosis not present

## 2018-05-11 DIAGNOSIS — R1310 Dysphagia, unspecified: Secondary | ICD-10-CM | POA: Diagnosis not present

## 2018-05-11 DIAGNOSIS — K219 Gastro-esophageal reflux disease without esophagitis: Secondary | ICD-10-CM | POA: Diagnosis not present

## 2018-05-12 DIAGNOSIS — M4712 Other spondylosis with myelopathy, cervical region: Secondary | ICD-10-CM | POA: Diagnosis not present

## 2018-05-12 DIAGNOSIS — M9901 Segmental and somatic dysfunction of cervical region: Secondary | ICD-10-CM | POA: Diagnosis not present

## 2018-05-12 DIAGNOSIS — M9902 Segmental and somatic dysfunction of thoracic region: Secondary | ICD-10-CM | POA: Diagnosis not present

## 2018-05-12 DIAGNOSIS — M542 Cervicalgia: Secondary | ICD-10-CM | POA: Diagnosis not present

## 2018-05-18 DIAGNOSIS — H4720 Unspecified optic atrophy: Secondary | ICD-10-CM | POA: Diagnosis not present

## 2018-05-18 DIAGNOSIS — H04123 Dry eye syndrome of bilateral lacrimal glands: Secondary | ICD-10-CM | POA: Diagnosis not present

## 2018-05-18 DIAGNOSIS — Z961 Presence of intraocular lens: Secondary | ICD-10-CM | POA: Diagnosis not present

## 2018-05-26 DIAGNOSIS — M542 Cervicalgia: Secondary | ICD-10-CM | POA: Diagnosis not present

## 2018-05-26 DIAGNOSIS — M9902 Segmental and somatic dysfunction of thoracic region: Secondary | ICD-10-CM | POA: Diagnosis not present

## 2018-05-26 DIAGNOSIS — M4712 Other spondylosis with myelopathy, cervical region: Secondary | ICD-10-CM | POA: Diagnosis not present

## 2018-05-26 DIAGNOSIS — M9901 Segmental and somatic dysfunction of cervical region: Secondary | ICD-10-CM | POA: Diagnosis not present

## 2018-06-15 DIAGNOSIS — Z0000 Encounter for general adult medical examination without abnormal findings: Secondary | ICD-10-CM | POA: Diagnosis not present

## 2018-06-15 DIAGNOSIS — E782 Mixed hyperlipidemia: Secondary | ICD-10-CM | POA: Diagnosis not present

## 2018-06-15 DIAGNOSIS — I10 Essential (primary) hypertension: Secondary | ICD-10-CM | POA: Diagnosis not present

## 2018-06-15 DIAGNOSIS — M8589 Other specified disorders of bone density and structure, multiple sites: Secondary | ICD-10-CM | POA: Diagnosis not present

## 2018-06-15 DIAGNOSIS — Z6827 Body mass index (BMI) 27.0-27.9, adult: Secondary | ICD-10-CM | POA: Diagnosis not present

## 2018-06-15 DIAGNOSIS — E559 Vitamin D deficiency, unspecified: Secondary | ICD-10-CM | POA: Diagnosis not present

## 2018-06-15 DIAGNOSIS — Z23 Encounter for immunization: Secondary | ICD-10-CM | POA: Diagnosis not present

## 2018-06-15 DIAGNOSIS — E034 Atrophy of thyroid (acquired): Secondary | ICD-10-CM | POA: Diagnosis not present

## 2018-06-15 DIAGNOSIS — Z1231 Encounter for screening mammogram for malignant neoplasm of breast: Secondary | ICD-10-CM | POA: Diagnosis not present

## 2018-06-15 DIAGNOSIS — K219 Gastro-esophageal reflux disease without esophagitis: Secondary | ICD-10-CM | POA: Diagnosis not present

## 2018-06-16 DIAGNOSIS — M9902 Segmental and somatic dysfunction of thoracic region: Secondary | ICD-10-CM | POA: Diagnosis not present

## 2018-06-16 DIAGNOSIS — M542 Cervicalgia: Secondary | ICD-10-CM | POA: Diagnosis not present

## 2018-06-16 DIAGNOSIS — M4712 Other spondylosis with myelopathy, cervical region: Secondary | ICD-10-CM | POA: Diagnosis not present

## 2018-06-16 DIAGNOSIS — M9901 Segmental and somatic dysfunction of cervical region: Secondary | ICD-10-CM | POA: Diagnosis not present

## 2018-06-30 DIAGNOSIS — M9902 Segmental and somatic dysfunction of thoracic region: Secondary | ICD-10-CM | POA: Diagnosis not present

## 2018-06-30 DIAGNOSIS — M4712 Other spondylosis with myelopathy, cervical region: Secondary | ICD-10-CM | POA: Diagnosis not present

## 2018-06-30 DIAGNOSIS — M542 Cervicalgia: Secondary | ICD-10-CM | POA: Diagnosis not present

## 2018-06-30 DIAGNOSIS — M9901 Segmental and somatic dysfunction of cervical region: Secondary | ICD-10-CM | POA: Diagnosis not present

## 2018-07-08 DIAGNOSIS — M85852 Other specified disorders of bone density and structure, left thigh: Secondary | ICD-10-CM | POA: Diagnosis not present

## 2018-07-08 DIAGNOSIS — M810 Age-related osteoporosis without current pathological fracture: Secondary | ICD-10-CM | POA: Diagnosis not present

## 2018-07-08 DIAGNOSIS — Z1231 Encounter for screening mammogram for malignant neoplasm of breast: Secondary | ICD-10-CM | POA: Diagnosis not present

## 2018-07-08 DIAGNOSIS — M85851 Other specified disorders of bone density and structure, right thigh: Secondary | ICD-10-CM | POA: Diagnosis not present

## 2018-07-14 DIAGNOSIS — M9901 Segmental and somatic dysfunction of cervical region: Secondary | ICD-10-CM | POA: Diagnosis not present

## 2018-07-14 DIAGNOSIS — M9902 Segmental and somatic dysfunction of thoracic region: Secondary | ICD-10-CM | POA: Diagnosis not present

## 2018-07-14 DIAGNOSIS — M4712 Other spondylosis with myelopathy, cervical region: Secondary | ICD-10-CM | POA: Diagnosis not present

## 2018-07-14 DIAGNOSIS — M542 Cervicalgia: Secondary | ICD-10-CM | POA: Diagnosis not present

## 2018-07-25 DIAGNOSIS — M9901 Segmental and somatic dysfunction of cervical region: Secondary | ICD-10-CM | POA: Diagnosis not present

## 2018-07-25 DIAGNOSIS — M4712 Other spondylosis with myelopathy, cervical region: Secondary | ICD-10-CM | POA: Diagnosis not present

## 2018-07-25 DIAGNOSIS — M542 Cervicalgia: Secondary | ICD-10-CM | POA: Diagnosis not present

## 2018-07-25 DIAGNOSIS — M9902 Segmental and somatic dysfunction of thoracic region: Secondary | ICD-10-CM | POA: Diagnosis not present

## 2018-08-08 DIAGNOSIS — M4712 Other spondylosis with myelopathy, cervical region: Secondary | ICD-10-CM | POA: Diagnosis not present

## 2018-08-08 DIAGNOSIS — M542 Cervicalgia: Secondary | ICD-10-CM | POA: Diagnosis not present

## 2018-08-08 DIAGNOSIS — M9901 Segmental and somatic dysfunction of cervical region: Secondary | ICD-10-CM | POA: Diagnosis not present

## 2018-08-08 DIAGNOSIS — M9902 Segmental and somatic dysfunction of thoracic region: Secondary | ICD-10-CM | POA: Diagnosis not present

## 2018-08-15 DIAGNOSIS — L578 Other skin changes due to chronic exposure to nonionizing radiation: Secondary | ICD-10-CM | POA: Diagnosis not present

## 2018-08-15 DIAGNOSIS — L570 Actinic keratosis: Secondary | ICD-10-CM | POA: Diagnosis not present

## 2018-08-15 DIAGNOSIS — L821 Other seborrheic keratosis: Secondary | ICD-10-CM | POA: Diagnosis not present

## 2018-08-25 DIAGNOSIS — M542 Cervicalgia: Secondary | ICD-10-CM | POA: Diagnosis not present

## 2018-08-25 DIAGNOSIS — M4712 Other spondylosis with myelopathy, cervical region: Secondary | ICD-10-CM | POA: Diagnosis not present

## 2018-08-25 DIAGNOSIS — M9901 Segmental and somatic dysfunction of cervical region: Secondary | ICD-10-CM | POA: Diagnosis not present

## 2018-08-25 DIAGNOSIS — M9902 Segmental and somatic dysfunction of thoracic region: Secondary | ICD-10-CM | POA: Diagnosis not present

## 2018-09-13 DIAGNOSIS — D485 Neoplasm of uncertain behavior of skin: Secondary | ICD-10-CM | POA: Diagnosis not present

## 2018-09-15 DIAGNOSIS — M542 Cervicalgia: Secondary | ICD-10-CM | POA: Diagnosis not present

## 2018-09-15 DIAGNOSIS — M9902 Segmental and somatic dysfunction of thoracic region: Secondary | ICD-10-CM | POA: Diagnosis not present

## 2018-09-15 DIAGNOSIS — M4712 Other spondylosis with myelopathy, cervical region: Secondary | ICD-10-CM | POA: Diagnosis not present

## 2018-09-15 DIAGNOSIS — M9901 Segmental and somatic dysfunction of cervical region: Secondary | ICD-10-CM | POA: Diagnosis not present

## 2018-09-19 DIAGNOSIS — M546 Pain in thoracic spine: Secondary | ICD-10-CM | POA: Diagnosis not present

## 2018-09-19 DIAGNOSIS — M8588 Other specified disorders of bone density and structure, other site: Secondary | ICD-10-CM | POA: Diagnosis not present

## 2018-09-19 DIAGNOSIS — E034 Atrophy of thyroid (acquired): Secondary | ICD-10-CM | POA: Diagnosis not present

## 2018-09-19 DIAGNOSIS — E038 Other specified hypothyroidism: Secondary | ICD-10-CM | POA: Diagnosis not present

## 2018-09-19 DIAGNOSIS — Z6827 Body mass index (BMI) 27.0-27.9, adult: Secondary | ICD-10-CM | POA: Diagnosis not present

## 2018-09-19 DIAGNOSIS — M4804 Spinal stenosis, thoracic region: Secondary | ICD-10-CM | POA: Diagnosis not present

## 2018-09-19 DIAGNOSIS — M19012 Primary osteoarthritis, left shoulder: Secondary | ICD-10-CM | POA: Diagnosis not present

## 2018-09-19 DIAGNOSIS — K219 Gastro-esophageal reflux disease without esophagitis: Secondary | ICD-10-CM | POA: Diagnosis not present

## 2018-09-19 DIAGNOSIS — E559 Vitamin D deficiency, unspecified: Secondary | ICD-10-CM | POA: Diagnosis not present

## 2018-09-19 DIAGNOSIS — M25512 Pain in left shoulder: Secondary | ICD-10-CM | POA: Diagnosis not present

## 2018-09-19 DIAGNOSIS — I10 Essential (primary) hypertension: Secondary | ICD-10-CM | POA: Diagnosis not present

## 2018-09-19 DIAGNOSIS — E782 Mixed hyperlipidemia: Secondary | ICD-10-CM | POA: Diagnosis not present

## 2018-09-21 DIAGNOSIS — R229 Localized swelling, mass and lump, unspecified: Secondary | ICD-10-CM | POA: Diagnosis not present

## 2018-09-21 DIAGNOSIS — M19041 Primary osteoarthritis, right hand: Secondary | ICD-10-CM | POA: Diagnosis not present

## 2018-09-23 ENCOUNTER — Other Ambulatory Visit: Payer: Self-pay | Admitting: Orthopedic Surgery

## 2018-09-23 DIAGNOSIS — IMO0002 Reserved for concepts with insufficient information to code with codable children: Secondary | ICD-10-CM

## 2018-09-23 DIAGNOSIS — R229 Localized swelling, mass and lump, unspecified: Principal | ICD-10-CM

## 2018-09-29 DIAGNOSIS — M542 Cervicalgia: Secondary | ICD-10-CM | POA: Diagnosis not present

## 2018-09-29 DIAGNOSIS — M4712 Other spondylosis with myelopathy, cervical region: Secondary | ICD-10-CM | POA: Diagnosis not present

## 2018-09-29 DIAGNOSIS — M9901 Segmental and somatic dysfunction of cervical region: Secondary | ICD-10-CM | POA: Diagnosis not present

## 2018-09-29 DIAGNOSIS — M9902 Segmental and somatic dysfunction of thoracic region: Secondary | ICD-10-CM | POA: Diagnosis not present

## 2018-10-18 DIAGNOSIS — M542 Cervicalgia: Secondary | ICD-10-CM | POA: Diagnosis not present

## 2018-10-18 DIAGNOSIS — M9901 Segmental and somatic dysfunction of cervical region: Secondary | ICD-10-CM | POA: Diagnosis not present

## 2018-10-18 DIAGNOSIS — M9902 Segmental and somatic dysfunction of thoracic region: Secondary | ICD-10-CM | POA: Diagnosis not present

## 2018-10-18 DIAGNOSIS — M4712 Other spondylosis with myelopathy, cervical region: Secondary | ICD-10-CM | POA: Diagnosis not present

## 2018-11-02 ENCOUNTER — Other Ambulatory Visit: Payer: Self-pay | Admitting: Orthopedic Surgery

## 2018-11-02 ENCOUNTER — Ambulatory Visit
Admission: RE | Admit: 2018-11-02 | Discharge: 2018-11-02 | Disposition: A | Discharge status: Home / Self Care | Payer: PPO | Source: Ambulatory Visit | Attending: Orthopedic Surgery | Admitting: Orthopedic Surgery

## 2018-11-02 DIAGNOSIS — IMO0002 Reserved for concepts with insufficient information to code with codable children: Secondary | ICD-10-CM

## 2018-11-02 DIAGNOSIS — R2232 Localized swelling, mass and lump, left upper limb: Secondary | ICD-10-CM | POA: Diagnosis not present

## 2018-11-02 DIAGNOSIS — R229 Localized swelling, mass and lump, unspecified: Principal | ICD-10-CM

## 2018-11-03 DIAGNOSIS — M9901 Segmental and somatic dysfunction of cervical region: Secondary | ICD-10-CM | POA: Diagnosis not present

## 2018-11-03 DIAGNOSIS — M542 Cervicalgia: Secondary | ICD-10-CM | POA: Diagnosis not present

## 2018-11-03 DIAGNOSIS — M4712 Other spondylosis with myelopathy, cervical region: Secondary | ICD-10-CM | POA: Diagnosis not present

## 2018-11-03 DIAGNOSIS — M9902 Segmental and somatic dysfunction of thoracic region: Secondary | ICD-10-CM | POA: Diagnosis not present

## 2018-11-17 DIAGNOSIS — M4712 Other spondylosis with myelopathy, cervical region: Secondary | ICD-10-CM | POA: Diagnosis not present

## 2018-11-17 DIAGNOSIS — M9902 Segmental and somatic dysfunction of thoracic region: Secondary | ICD-10-CM | POA: Diagnosis not present

## 2018-11-17 DIAGNOSIS — M9901 Segmental and somatic dysfunction of cervical region: Secondary | ICD-10-CM | POA: Diagnosis not present

## 2018-11-17 DIAGNOSIS — M542 Cervicalgia: Secondary | ICD-10-CM | POA: Diagnosis not present

## 2018-12-15 DIAGNOSIS — M9902 Segmental and somatic dysfunction of thoracic region: Secondary | ICD-10-CM | POA: Diagnosis not present

## 2018-12-15 DIAGNOSIS — M9901 Segmental and somatic dysfunction of cervical region: Secondary | ICD-10-CM | POA: Diagnosis not present

## 2018-12-15 DIAGNOSIS — M542 Cervicalgia: Secondary | ICD-10-CM | POA: Diagnosis not present

## 2018-12-15 DIAGNOSIS — M4712 Other spondylosis with myelopathy, cervical region: Secondary | ICD-10-CM | POA: Diagnosis not present

## 2018-12-20 DIAGNOSIS — I10 Essential (primary) hypertension: Secondary | ICD-10-CM | POA: Diagnosis not present

## 2018-12-20 DIAGNOSIS — R911 Solitary pulmonary nodule: Secondary | ICD-10-CM | POA: Diagnosis not present

## 2018-12-20 DIAGNOSIS — M19012 Primary osteoarthritis, left shoulder: Secondary | ICD-10-CM | POA: Diagnosis not present

## 2018-12-20 DIAGNOSIS — E782 Mixed hyperlipidemia: Secondary | ICD-10-CM | POA: Diagnosis not present

## 2018-12-20 DIAGNOSIS — I69398 Other sequelae of cerebral infarction: Secondary | ICD-10-CM | POA: Diagnosis not present

## 2018-12-20 DIAGNOSIS — E034 Atrophy of thyroid (acquired): Secondary | ICD-10-CM | POA: Diagnosis not present

## 2018-12-20 DIAGNOSIS — R590 Localized enlarged lymph nodes: Secondary | ICD-10-CM | POA: Diagnosis not present

## 2018-12-20 DIAGNOSIS — K219 Gastro-esophageal reflux disease without esophagitis: Secondary | ICD-10-CM | POA: Diagnosis not present

## 2018-12-29 DIAGNOSIS — M4712 Other spondylosis with myelopathy, cervical region: Secondary | ICD-10-CM | POA: Diagnosis not present

## 2018-12-29 DIAGNOSIS — M9901 Segmental and somatic dysfunction of cervical region: Secondary | ICD-10-CM | POA: Diagnosis not present

## 2018-12-29 DIAGNOSIS — M542 Cervicalgia: Secondary | ICD-10-CM | POA: Diagnosis not present

## 2018-12-29 DIAGNOSIS — M9902 Segmental and somatic dysfunction of thoracic region: Secondary | ICD-10-CM | POA: Diagnosis not present

## 2019-01-12 DIAGNOSIS — M9902 Segmental and somatic dysfunction of thoracic region: Secondary | ICD-10-CM | POA: Diagnosis not present

## 2019-01-12 DIAGNOSIS — M542 Cervicalgia: Secondary | ICD-10-CM | POA: Diagnosis not present

## 2019-01-12 DIAGNOSIS — M9901 Segmental and somatic dysfunction of cervical region: Secondary | ICD-10-CM | POA: Diagnosis not present

## 2019-01-12 DIAGNOSIS — M4712 Other spondylosis with myelopathy, cervical region: Secondary | ICD-10-CM | POA: Diagnosis not present

## 2019-01-26 DIAGNOSIS — M9902 Segmental and somatic dysfunction of thoracic region: Secondary | ICD-10-CM | POA: Diagnosis not present

## 2019-01-26 DIAGNOSIS — M9901 Segmental and somatic dysfunction of cervical region: Secondary | ICD-10-CM | POA: Diagnosis not present

## 2019-01-26 DIAGNOSIS — M542 Cervicalgia: Secondary | ICD-10-CM | POA: Diagnosis not present

## 2019-01-26 DIAGNOSIS — M4712 Other spondylosis with myelopathy, cervical region: Secondary | ICD-10-CM | POA: Diagnosis not present

## 2019-02-01 DIAGNOSIS — L219 Seborrheic dermatitis, unspecified: Secondary | ICD-10-CM | POA: Diagnosis not present

## 2019-02-01 DIAGNOSIS — L570 Actinic keratosis: Secondary | ICD-10-CM | POA: Diagnosis not present

## 2019-02-09 DIAGNOSIS — M4712 Other spondylosis with myelopathy, cervical region: Secondary | ICD-10-CM | POA: Diagnosis not present

## 2019-02-09 DIAGNOSIS — M542 Cervicalgia: Secondary | ICD-10-CM | POA: Diagnosis not present

## 2019-02-09 DIAGNOSIS — M9902 Segmental and somatic dysfunction of thoracic region: Secondary | ICD-10-CM | POA: Diagnosis not present

## 2019-02-09 DIAGNOSIS — M9901 Segmental and somatic dysfunction of cervical region: Secondary | ICD-10-CM | POA: Diagnosis not present

## 2019-02-23 DIAGNOSIS — M4712 Other spondylosis with myelopathy, cervical region: Secondary | ICD-10-CM | POA: Diagnosis not present

## 2019-02-23 DIAGNOSIS — M9902 Segmental and somatic dysfunction of thoracic region: Secondary | ICD-10-CM | POA: Diagnosis not present

## 2019-02-23 DIAGNOSIS — M542 Cervicalgia: Secondary | ICD-10-CM | POA: Diagnosis not present

## 2019-02-23 DIAGNOSIS — M9901 Segmental and somatic dysfunction of cervical region: Secondary | ICD-10-CM | POA: Diagnosis not present

## 2019-03-02 DIAGNOSIS — H469 Unspecified optic neuritis: Secondary | ICD-10-CM | POA: Diagnosis not present

## 2019-03-15 DIAGNOSIS — R1011 Right upper quadrant pain: Secondary | ICD-10-CM | POA: Diagnosis not present

## 2019-03-16 DIAGNOSIS — M9901 Segmental and somatic dysfunction of cervical region: Secondary | ICD-10-CM | POA: Diagnosis not present

## 2019-03-16 DIAGNOSIS — M542 Cervicalgia: Secondary | ICD-10-CM | POA: Diagnosis not present

## 2019-03-16 DIAGNOSIS — M4712 Other spondylosis with myelopathy, cervical region: Secondary | ICD-10-CM | POA: Diagnosis not present

## 2019-03-16 DIAGNOSIS — M9902 Segmental and somatic dysfunction of thoracic region: Secondary | ICD-10-CM | POA: Diagnosis not present

## 2019-03-21 DIAGNOSIS — R1011 Right upper quadrant pain: Secondary | ICD-10-CM | POA: Diagnosis not present

## 2019-03-21 DIAGNOSIS — R110 Nausea: Secondary | ICD-10-CM | POA: Diagnosis not present

## 2019-03-21 DIAGNOSIS — R079 Chest pain, unspecified: Secondary | ICD-10-CM | POA: Diagnosis not present

## 2019-03-21 DIAGNOSIS — K449 Diaphragmatic hernia without obstruction or gangrene: Secondary | ICD-10-CM | POA: Diagnosis not present

## 2019-03-21 DIAGNOSIS — R0789 Other chest pain: Secondary | ICD-10-CM | POA: Diagnosis not present

## 2019-03-22 DIAGNOSIS — E034 Atrophy of thyroid (acquired): Secondary | ICD-10-CM | POA: Diagnosis not present

## 2019-03-22 DIAGNOSIS — E559 Vitamin D deficiency, unspecified: Secondary | ICD-10-CM | POA: Diagnosis not present

## 2019-03-22 DIAGNOSIS — I10 Essential (primary) hypertension: Secondary | ICD-10-CM | POA: Diagnosis not present

## 2019-03-22 DIAGNOSIS — E782 Mixed hyperlipidemia: Secondary | ICD-10-CM | POA: Diagnosis not present

## 2019-03-22 DIAGNOSIS — K219 Gastro-esophageal reflux disease without esophagitis: Secondary | ICD-10-CM | POA: Diagnosis not present

## 2019-03-22 DIAGNOSIS — I69398 Other sequelae of cerebral infarction: Secondary | ICD-10-CM | POA: Diagnosis not present

## 2019-03-30 DIAGNOSIS — M9902 Segmental and somatic dysfunction of thoracic region: Secondary | ICD-10-CM | POA: Diagnosis not present

## 2019-03-30 DIAGNOSIS — M542 Cervicalgia: Secondary | ICD-10-CM | POA: Diagnosis not present

## 2019-03-30 DIAGNOSIS — M4712 Other spondylosis with myelopathy, cervical region: Secondary | ICD-10-CM | POA: Diagnosis not present

## 2019-03-30 DIAGNOSIS — M9901 Segmental and somatic dysfunction of cervical region: Secondary | ICD-10-CM | POA: Diagnosis not present

## 2019-04-09 DIAGNOSIS — R221 Localized swelling, mass and lump, neck: Secondary | ICD-10-CM | POA: Diagnosis not present

## 2019-04-13 DIAGNOSIS — M542 Cervicalgia: Secondary | ICD-10-CM | POA: Diagnosis not present

## 2019-04-13 DIAGNOSIS — M9902 Segmental and somatic dysfunction of thoracic region: Secondary | ICD-10-CM | POA: Diagnosis not present

## 2019-04-13 DIAGNOSIS — M4712 Other spondylosis with myelopathy, cervical region: Secondary | ICD-10-CM | POA: Diagnosis not present

## 2019-04-13 DIAGNOSIS — M9901 Segmental and somatic dysfunction of cervical region: Secondary | ICD-10-CM | POA: Diagnosis not present

## 2019-04-27 DIAGNOSIS — M9902 Segmental and somatic dysfunction of thoracic region: Secondary | ICD-10-CM | POA: Diagnosis not present

## 2019-04-27 DIAGNOSIS — M542 Cervicalgia: Secondary | ICD-10-CM | POA: Diagnosis not present

## 2019-04-27 DIAGNOSIS — M9901 Segmental and somatic dysfunction of cervical region: Secondary | ICD-10-CM | POA: Diagnosis not present

## 2019-04-27 DIAGNOSIS — M4712 Other spondylosis with myelopathy, cervical region: Secondary | ICD-10-CM | POA: Diagnosis not present

## 2019-05-11 DIAGNOSIS — M9902 Segmental and somatic dysfunction of thoracic region: Secondary | ICD-10-CM | POA: Diagnosis not present

## 2019-05-11 DIAGNOSIS — M542 Cervicalgia: Secondary | ICD-10-CM | POA: Diagnosis not present

## 2019-05-11 DIAGNOSIS — M9901 Segmental and somatic dysfunction of cervical region: Secondary | ICD-10-CM | POA: Diagnosis not present

## 2019-05-11 DIAGNOSIS — M4712 Other spondylosis with myelopathy, cervical region: Secondary | ICD-10-CM | POA: Diagnosis not present

## 2019-05-31 DIAGNOSIS — L814 Other melanin hyperpigmentation: Secondary | ICD-10-CM | POA: Diagnosis not present

## 2019-05-31 DIAGNOSIS — L820 Inflamed seborrheic keratosis: Secondary | ICD-10-CM | POA: Diagnosis not present

## 2019-05-31 DIAGNOSIS — L728 Other follicular cysts of the skin and subcutaneous tissue: Secondary | ICD-10-CM | POA: Diagnosis not present

## 2019-06-01 DIAGNOSIS — M9901 Segmental and somatic dysfunction of cervical region: Secondary | ICD-10-CM | POA: Diagnosis not present

## 2019-06-01 DIAGNOSIS — M4712 Other spondylosis with myelopathy, cervical region: Secondary | ICD-10-CM | POA: Diagnosis not present

## 2019-06-01 DIAGNOSIS — M542 Cervicalgia: Secondary | ICD-10-CM | POA: Diagnosis not present

## 2019-06-01 DIAGNOSIS — R51 Headache: Secondary | ICD-10-CM | POA: Diagnosis not present

## 2019-06-01 DIAGNOSIS — M9902 Segmental and somatic dysfunction of thoracic region: Secondary | ICD-10-CM | POA: Diagnosis not present

## 2019-06-15 DIAGNOSIS — M542 Cervicalgia: Secondary | ICD-10-CM | POA: Diagnosis not present

## 2019-06-15 DIAGNOSIS — M9902 Segmental and somatic dysfunction of thoracic region: Secondary | ICD-10-CM | POA: Diagnosis not present

## 2019-06-15 DIAGNOSIS — M9901 Segmental and somatic dysfunction of cervical region: Secondary | ICD-10-CM | POA: Diagnosis not present

## 2019-06-15 DIAGNOSIS — M4712 Other spondylosis with myelopathy, cervical region: Secondary | ICD-10-CM | POA: Diagnosis not present

## 2019-06-28 DIAGNOSIS — E034 Atrophy of thyroid (acquired): Secondary | ICD-10-CM | POA: Diagnosis not present

## 2019-06-28 DIAGNOSIS — I10 Essential (primary) hypertension: Secondary | ICD-10-CM | POA: Diagnosis not present

## 2019-06-28 DIAGNOSIS — E559 Vitamin D deficiency, unspecified: Secondary | ICD-10-CM | POA: Diagnosis not present

## 2019-06-28 DIAGNOSIS — E782 Mixed hyperlipidemia: Secondary | ICD-10-CM | POA: Diagnosis not present

## 2019-06-28 DIAGNOSIS — K219 Gastro-esophageal reflux disease without esophagitis: Secondary | ICD-10-CM | POA: Diagnosis not present

## 2019-06-28 DIAGNOSIS — I69398 Other sequelae of cerebral infarction: Secondary | ICD-10-CM | POA: Diagnosis not present

## 2019-06-28 DIAGNOSIS — R590 Localized enlarged lymph nodes: Secondary | ICD-10-CM | POA: Diagnosis not present

## 2019-06-28 DIAGNOSIS — Z23 Encounter for immunization: Secondary | ICD-10-CM | POA: Diagnosis not present

## 2019-07-21 DIAGNOSIS — R590 Localized enlarged lymph nodes: Secondary | ICD-10-CM | POA: Diagnosis not present

## 2019-07-21 DIAGNOSIS — M5432 Sciatica, left side: Secondary | ICD-10-CM | POA: Diagnosis not present

## 2019-07-25 DIAGNOSIS — R05 Cough: Secondary | ICD-10-CM | POA: Diagnosis not present

## 2019-07-25 DIAGNOSIS — J018 Other acute sinusitis: Secondary | ICD-10-CM | POA: Diagnosis not present

## 2019-07-25 DIAGNOSIS — R10811 Right upper quadrant abdominal tenderness: Secondary | ICD-10-CM | POA: Diagnosis not present

## 2019-07-25 DIAGNOSIS — R519 Headache, unspecified: Secondary | ICD-10-CM | POA: Diagnosis not present

## 2019-08-02 DIAGNOSIS — R590 Localized enlarged lymph nodes: Secondary | ICD-10-CM | POA: Diagnosis not present

## 2019-08-02 DIAGNOSIS — R221 Localized swelling, mass and lump, neck: Secondary | ICD-10-CM | POA: Diagnosis not present

## 2019-08-18 DIAGNOSIS — R0789 Other chest pain: Secondary | ICD-10-CM | POA: Diagnosis not present

## 2019-08-18 DIAGNOSIS — I10 Essential (primary) hypertension: Secondary | ICD-10-CM | POA: Diagnosis not present

## 2019-08-18 DIAGNOSIS — R079 Chest pain, unspecified: Secondary | ICD-10-CM | POA: Diagnosis not present

## 2019-08-18 DIAGNOSIS — R109 Unspecified abdominal pain: Secondary | ICD-10-CM | POA: Diagnosis not present

## 2019-08-18 DIAGNOSIS — K7689 Other specified diseases of liver: Secondary | ICD-10-CM | POA: Diagnosis not present

## 2019-08-18 DIAGNOSIS — R1011 Right upper quadrant pain: Secondary | ICD-10-CM | POA: Diagnosis not present

## 2019-08-19 DIAGNOSIS — R109 Unspecified abdominal pain: Secondary | ICD-10-CM | POA: Diagnosis not present

## 2019-08-24 DIAGNOSIS — R1084 Generalized abdominal pain: Secondary | ICD-10-CM | POA: Diagnosis not present

## 2019-08-24 DIAGNOSIS — R10811 Right upper quadrant abdominal tenderness: Secondary | ICD-10-CM | POA: Diagnosis not present

## 2019-08-28 DIAGNOSIS — M9901 Segmental and somatic dysfunction of cervical region: Secondary | ICD-10-CM | POA: Diagnosis not present

## 2019-08-28 DIAGNOSIS — M542 Cervicalgia: Secondary | ICD-10-CM | POA: Diagnosis not present

## 2019-08-28 DIAGNOSIS — M4712 Other spondylosis with myelopathy, cervical region: Secondary | ICD-10-CM | POA: Diagnosis not present

## 2019-08-28 DIAGNOSIS — M9902 Segmental and somatic dysfunction of thoracic region: Secondary | ICD-10-CM | POA: Diagnosis not present

## 2019-09-07 DIAGNOSIS — M9902 Segmental and somatic dysfunction of thoracic region: Secondary | ICD-10-CM | POA: Diagnosis not present

## 2019-09-07 DIAGNOSIS — M9901 Segmental and somatic dysfunction of cervical region: Secondary | ICD-10-CM | POA: Diagnosis not present

## 2019-09-07 DIAGNOSIS — M542 Cervicalgia: Secondary | ICD-10-CM | POA: Diagnosis not present

## 2019-09-07 DIAGNOSIS — M4712 Other spondylosis with myelopathy, cervical region: Secondary | ICD-10-CM | POA: Diagnosis not present

## 2019-09-08 DIAGNOSIS — R1011 Right upper quadrant pain: Secondary | ICD-10-CM | POA: Diagnosis not present

## 2019-09-08 DIAGNOSIS — R1013 Epigastric pain: Secondary | ICD-10-CM | POA: Diagnosis not present

## 2019-09-08 DIAGNOSIS — R112 Nausea with vomiting, unspecified: Secondary | ICD-10-CM | POA: Diagnosis not present

## 2019-09-08 DIAGNOSIS — Z5320 Procedure and treatment not carried out because of patient's decision for unspecified reasons: Secondary | ICD-10-CM | POA: Diagnosis not present

## 2019-09-09 ENCOUNTER — Other Ambulatory Visit: Payer: Self-pay

## 2019-09-09 ENCOUNTER — Emergency Department (HOSPITAL_COMMUNITY)
Admission: EM | Admit: 2019-09-09 | Discharge: 2019-09-10 | Disposition: A | Discharge status: Home / Self Care | Payer: PPO | Attending: Emergency Medicine | Admitting: Emergency Medicine

## 2019-09-09 ENCOUNTER — Encounter (HOSPITAL_COMMUNITY): Payer: Self-pay | Admitting: Emergency Medicine

## 2019-09-09 ENCOUNTER — Emergency Department (HOSPITAL_COMMUNITY): Payer: PPO

## 2019-09-09 DIAGNOSIS — Z7982 Long term (current) use of aspirin: Secondary | ICD-10-CM | POA: Diagnosis not present

## 2019-09-09 DIAGNOSIS — Z7902 Long term (current) use of antithrombotics/antiplatelets: Secondary | ICD-10-CM | POA: Diagnosis not present

## 2019-09-09 DIAGNOSIS — T508X5A Adverse effect of diagnostic agents, initial encounter: Secondary | ICD-10-CM | POA: Insufficient documentation

## 2019-09-09 DIAGNOSIS — R109 Unspecified abdominal pain: Secondary | ICD-10-CM | POA: Diagnosis not present

## 2019-09-09 DIAGNOSIS — R079 Chest pain, unspecified: Secondary | ICD-10-CM | POA: Diagnosis not present

## 2019-09-09 DIAGNOSIS — R1011 Right upper quadrant pain: Secondary | ICD-10-CM | POA: Diagnosis not present

## 2019-09-09 DIAGNOSIS — G8929 Other chronic pain: Secondary | ICD-10-CM | POA: Insufficient documentation

## 2019-09-09 DIAGNOSIS — Z20822 Contact with and (suspected) exposure to covid-19: Secondary | ICD-10-CM | POA: Insufficient documentation

## 2019-09-09 DIAGNOSIS — L500 Allergic urticaria: Secondary | ICD-10-CM | POA: Insufficient documentation

## 2019-09-09 DIAGNOSIS — Z8673 Personal history of transient ischemic attack (TIA), and cerebral infarction without residual deficits: Secondary | ICD-10-CM | POA: Diagnosis not present

## 2019-09-09 DIAGNOSIS — Z79899 Other long term (current) drug therapy: Secondary | ICD-10-CM | POA: Diagnosis not present

## 2019-09-09 HISTORY — DX: Cerebral infarction, unspecified: I63.9

## 2019-09-09 LAB — COMPREHENSIVE METABOLIC PANEL
ALT: 23 U/L (ref 0–44)
AST: 20 U/L (ref 15–41)
Albumin: 4 g/dL (ref 3.5–5.0)
Alkaline Phosphatase: 75 U/L (ref 38–126)
Anion gap: 10 (ref 5–15)
BUN: 19 mg/dL (ref 8–23)
CO2: 23 mmol/L (ref 22–32)
Calcium: 9.4 mg/dL (ref 8.9–10.3)
Chloride: 108 mmol/L (ref 98–111)
Creatinine, Ser: 1.14 mg/dL — ABNORMAL HIGH (ref 0.44–1.00)
GFR calc Af Amer: 50 mL/min — ABNORMAL LOW (ref >60)
GFR calc non Af Amer: 43 mL/min — ABNORMAL LOW (ref >60)
Glucose, Bld: 106 mg/dL — ABNORMAL HIGH (ref 70–99)
Potassium: 3.8 mmol/L (ref 3.5–5.1)
Sodium: 141 mmol/L (ref 135–145)
Total Bilirubin: 0.5 mg/dL (ref 0.3–1.2)
Total Protein: 6.7 g/dL (ref 6.5–8.1)

## 2019-09-09 LAB — CBC WITH DIFFERENTIAL/PLATELET
Abs Immature Granulocytes: 0.01 10*3/uL (ref 0.00–0.07)
Basophils Absolute: 0 10*3/uL (ref 0.0–0.1)
Basophils Relative: 0 %
Eosinophils Absolute: 0.1 10*3/uL (ref 0.0–0.5)
Eosinophils Relative: 2 %
HCT: 36.2 % (ref 36.0–46.0)
Hemoglobin: 11.8 g/dL — ABNORMAL LOW (ref 12.0–15.0)
Immature Granulocytes: 0 %
Lymphocytes Relative: 34 %
Lymphs Abs: 1.7 10*3/uL (ref 0.7–4.0)
MCH: 30.1 pg (ref 26.0–34.0)
MCHC: 32.6 g/dL (ref 30.0–36.0)
MCV: 92.3 fL (ref 80.0–100.0)
Monocytes Absolute: 0.6 10*3/uL (ref 0.1–1.0)
Monocytes Relative: 12 %
Neutro Abs: 2.6 10*3/uL (ref 1.7–7.7)
Neutrophils Relative %: 52 %
Platelets: 200 10*3/uL (ref 150–400)
RBC: 3.92 MIL/uL (ref 3.87–5.11)
RDW: 14.9 % (ref 11.5–15.5)
WBC: 5 10*3/uL (ref 4.0–10.5)
nRBC: 0 % (ref 0.0–0.2)

## 2019-09-09 LAB — RESPIRATORY PANEL BY RT PCR (FLU A&B, COVID)
Influenza A by PCR: NEGATIVE
Influenza B by PCR: NEGATIVE
SARS Coronavirus 2 by RT PCR: NEGATIVE

## 2019-09-09 MED ORDER — FENTANYL CITRATE (PF) 100 MCG/2ML IJ SOLN
50.0000 ug | INTRAMUSCULAR | Status: DC | PRN
  Administered 2019-09-09 (×2): 50 ug via INTRAVENOUS
  Filled 2019-09-09 (×2): qty 2

## 2019-09-09 MED ORDER — METOCLOPRAMIDE HCL 5 MG/ML IJ SOLN
5.0000 mg | Freq: Once | INTRAMUSCULAR | Status: AC
  Administered 2019-09-09: 5 mg via INTRAVENOUS
  Filled 2019-09-09: qty 2

## 2019-09-09 MED ORDER — SODIUM CHLORIDE 0.9 % IV SOLN: Freq: Once | INTRAVENOUS | Status: AC

## 2019-09-09 MED ORDER — SODIUM CHLORIDE 0.9 % IV BOLUS
500.0000 mL | Freq: Once | INTRAVENOUS | Status: AC
  Administered 2019-09-09: 05:00:00 500 mL via INTRAVENOUS

## 2019-09-09 MED ORDER — ONDANSETRON HCL 4 MG/2ML IJ SOLN
4.0000 mg | Freq: Once | INTRAMUSCULAR | Status: AC
  Administered 2019-09-09: 4 mg via INTRAVENOUS
  Filled 2019-09-09: qty 2

## 2019-09-09 MED ORDER — TECHNETIUM TC 99M MEBROFENIN IV KIT
5.2200 | PACK | Freq: Once | INTRAVENOUS | Status: AC
  Administered 2019-09-09: 21:00:00 5.22 via INTRAVENOUS

## 2019-09-09 MED ORDER — ACETAMINOPHEN 325 MG PO TABS
650.0000 mg | ORAL_TABLET | Freq: Once | ORAL | Status: AC
  Administered 2019-09-09: 650 mg via ORAL
  Filled 2019-09-09: qty 2

## 2019-09-09 NOTE — ED Provider Notes (Signed)
[facility] Provider Note: Georgena Spurling, MD, FACEP  CSN: [id number] MRN: [id number] ARRIVAL: 09/09/19 at [place]: [id number]/[id number]   CHIEF COMPLAINT  Abdominal Pain   HISTORY OF PRESENT ILLNESS  09/09/19 4:08 AM Elizabeth Bradley is a 86 y.o. female who has been having right upper quadrant pain.  This typically occurs after eating and she had a particularly severe episode yesterday.  The pain begins in her right upper quadrant and radiates into her chest and around to her right shoulder.  It is associated with nausea but no vomiting.  She was seen at [facility] yesterday afternoon and had an ultrasound and blood work.  These were unremarkable but she was told she needed a HIDA scan which was not available there.  The patient denies significant pain at the present time but is nauseated.   Past Medical History:  Diagnosis Date  . Stroke [facility])     Past Surgical History:  Procedure Laterality Date  . ABDOMINAL HYSTERECTOMY      No family history on file.  Social History   Tobacco Use  . Smoking status: Not on file  . Smokeless tobacco: Never Used  Substance Use Topics  . Alcohol use: Not on file  . Drug use: Not on file    Prior to Admission medications   Medication Sig Start Date End Date Taking? Authorizing Provider  amLODipine (NORVASC) 2.5 MG tablet Take 1 tablet by mouth daily.   Yes [provider]  aspirin EC 81 MG tablet Take 81 mg by mouth daily.   Yes [provider]  clopidogrel (PLAVIX) 75 MG tablet Take 1 tablet by mouth daily. 07/21/19  Yes [provider]  levothyroxine (SYNTHROID) 25 MCG tablet Take 1 tablet by mouth daily. 06/28/19  Yes [provider]  simvastatin (ZOCOR) 5 MG tablet Take 1 tablet by mouth every evening.   Yes [provider]    Allergies Penicillins   REVIEW OF SYSTEMS  Negative except as noted here or in the History of Present Illness.   PHYSICAL EXAMINATION    Initial Vital Signs Blood pressure (!) 170/83, pulse 76, temperature 97.9 F (36.6 C), temperature source Oral, resp. rate 17, height 4\' 11"  (1.499 m), weight 60.8 kg, SpO2 98 %.  Examination General: Well-developed, well-nourished female in no acute distress; appearance consistent with age of record HENT: normocephalic; atraumatic Eyes: pupils equal, round and reactive to light; extraocular muscles intact; poor visual acuity Neck: supple Heart: regular rate and rhythm Lungs: clear to auscultation bilaterally Abdomen: soft; nondistended; right upper quadrant tenderness; bowel sounds present Extremities: No deformity; full range of motion; pulses normal Neurologic: Awake, alert and oriented; motor function intact in all extremities and symmetric; no facial droop Skin: Warm and dry Psychiatric: Normal mood and affect   RESULTS  Summary of this visit's results, reviewed and interpreted by myself:   EKG Interpretation  Date/Time:  Saturday September 09 2019 18:51:48 EST Ventricular Rate:  80 PR Interval:    QRS Duration: 84 QT Interval:  372 QTC Calculation: 430 R Axis:   7 Text Interpretation: Sinus rhythm Anterior infarct, old Confirmed by Quintella Reichert [phone]) on 09/09/2019 10:39:27 PM      Laboratory Studies: Results for orders placed or performed during the hospital encounter of 09/09/19 (from the past 24 hour(s))  Comprehensive metabolic panel     Status: Abnormal   Collection Time: 09/09/19  4:30 AM  Result Value Ref Range   Sodium 141 135 -  145 mmol/L   Potassium 3.8 3.5 - 5.1 mmol/L   Chloride 108 98 - 111 mmol/L   CO2 23 22 - 32 mmol/L   Glucose, Bld 106 (H) 70 - 99 mg/dL   BUN 19 8 - 23 mg/dL   Creatinine, Ser 1.14 (H) 0.44 - 1.00 mg/dL   Calcium 9.4 8.9 - 10.3 mg/dL   Total Protein 6.7 6.5 - 8.1 g/dL   Albumin 4.0 3.5 - 5.0 g/dL   AST 20 15 - 41 U/L   ALT 23 0 - 44 U/L   Alkaline Phosphatase 75 38 - 126 U/L   Total Bilirubin 0.5 0.3 - 1.2 mg/dL   GFR calc  non Af Amer 43 (L) >60 mL/min   GFR calc Af Amer 50 (L) >60 mL/min   Anion gap 10 5 - 15  CBC with Differential     Status: Abnormal   Collection Time: 09/09/19  4:30 AM  Result Value Ref Range   WBC 5.0 4.0 - 10.5 K/uL   RBC 3.92 3.87 - 5.11 MIL/uL   Hemoglobin 11.8 (L) 12.0 - 15.0 g/dL   HCT 36.2 36.0 - 46.0 %   MCV 92.3 80.0 - 100.0 fL   MCH 30.1 26.0 - 34.0 pg   MCHC 32.6 30.0 - 36.0 g/dL   RDW 14.9 11.5 - 15.5 %   Platelets 200 150 - 400 K/uL   nRBC 0.0 0.0 - 0.2 %   Neutrophils Relative % 52 %   Neutro Abs 2.6 1.7 - 7.7 K/uL   Lymphocytes Relative 34 %   Lymphs Abs 1.7 0.7 - 4.0 K/uL   Monocytes Relative 12 %   Monocytes Absolute 0.6 0.1 - 1.0 K/uL   Eosinophils Relative 2 %   Eosinophils Absolute 0.1 0.0 - 0.5 K/uL   Basophils Relative 0 %   Basophils Absolute 0.0 0.0 - 0.1 K/uL   Immature Granulocytes 0 %   Abs Immature Granulocytes 0.01 0.00 - 0.07 K/uL  Respiratory Panel by RT PCR (Flu A&B, Covid) - Nasopharyngeal Swab     Status: None   Collection Time: 09/09/19  1:41 PM   Specimen: Nasopharyngeal Swab  Result Value Ref Range   SARS Coronavirus 2 by RT PCR NEGATIVE NEGATIVE   Influenza A by PCR NEGATIVE NEGATIVE   Influenza B by PCR NEGATIVE NEGATIVE   Imaging Studies: NM Hepatobiliary Liver Func  Result Date: 09/09/2019 CLINICAL DATA:  Right upper quadrant pain.  Nausea. EXAM: NUCLEAR MEDICINE HEPATOBILIARY IMAGING TECHNIQUE: Sequential images of the abdomen were obtained out to 60 minutes following intravenous administration of radiopharmaceutical. RADIOPHARMACEUTICALS:  5.22 mCi Tc-30m  Choletec IV COMPARISON:  Abdominal ultrasound 09/08/2019, hepatic biliary scan 11/05/2016 FINDINGS: Prompt uptake and biliary excretion of activity by the liver is seen. Gallbladder activity is visualized, consistent with patency of cystic duct. Biliary activity passes into small bowel, consistent with patent common bile duct. IMPRESSION: Normal HIDA scan.  Patency of the cystic and  common bile ducts. Electronically Signed   By: Keith Rake M.D.   On: 09/09/2019 22:29    ED COURSE and MDM  Nursing notes, initial and subsequent vitals signs, including pulse oximetry, reviewed and interpreted by myself.  Vitals:   09/09/19 1700 09/09/19 1800 09/09/19 1830 09/09/19 1930  BP: (!) 143/59 (!) 156/65 (!) 163/63 (!) 165/83  Pulse: 69 71 78 81  Resp:   19 14  Temp:      TempSrc:      SpO2: 97% 98% 99% 99%  Weight:      Height:       Medications  acetaminophen (TYLENOL) tablet 650 mg (has no administration in time range)  sodium chloride 0.9 % bolus 500 mL (0 mLs Intravenous Stopped 09/09/19 0554)  ondansetron (ZOFRAN) injection 4 mg (4 mg Intravenous Given 09/09/19 0500)  0.9 %  sodium chloride infusion ( Intravenous New Bag/Given 09/09/19 1647)  metoCLOPramide (REGLAN) injection 5 mg (5 mg Intravenous Given 09/09/19 1953)  technetium TC 34M mebrofenin (CHOLETEC) injection 123456 millicurie (123456 millicuries Intravenous Contrast Given 09/09/19 2110)   7:00 AM Signed out to Dr. Laverta Baltimore   PROCEDURES  Procedures   ED DIAGNOSES     ICD-10-CM   1. Right upper quadrant abdominal pain  R10.11        Semaje Kinker, Jenny Reichmann, MD 09/09/19 2239

## 2019-09-09 NOTE — ED Notes (Signed)
Paged nuc med tech

## 2019-09-09 NOTE — ED Notes (Signed)
[place] from Nuclear Medicine advised that patient would be able to have scan done around 11am. Pt can not have any further opiate medications 6hr prior to scan.

## 2019-09-09 NOTE — ED Triage Notes (Signed)
Pt has been having gallbladder pain that started yesterday. Pt family states she was seen at [facility] last night and had ultrasound, blood work, and chest xray but they did not receive results.

## 2019-09-09 NOTE — ED Notes (Signed)
Patient taken for Hepato scan

## 2019-09-09 NOTE — Procedures (Signed)
S/W ED Nurse. To notify on call nuc med tech when covid and u/s report has resulted so appropriate protocol can be followed.

## 2019-09-09 NOTE — ED Provider Notes (Signed)
Blood pressure (!) 146/61, pulse 71, temperature 97.9 F (36.6 C), temperature source Oral, resp. rate 16, height 4\' 11"  (1.499 m), weight 60.8 kg, SpO2 97 %.  Assuming care from Dr. Florina Ou.  In short, Elizabeth Bradley is a 86 y.o. female with a chief complaint of Abdominal Pain .  Refer to the original H&P for additional details.  The current plan of care is to f/u on HIDA scan and reassess.  COVID test pending and after negative test the patient will be ready for HIDA. NM team following COVID results.   Care transferred to Dr. Ralene Bathe pending HIDA scan.     Margette Fast, MD 09/09/19 2004

## 2019-09-09 NOTE — ED Provider Notes (Signed)
  Physical Exam  BP (!) 138/53   Pulse 71   Temp 97.9 F (36.6 C) (Oral)   Resp 15   Ht 4\' 11"  (1.499 m)   Wt 60.8 kg   SpO2 97%   BMI 27.06 kg/m   Physical Exam  ED Course/Procedures     Procedures  MDM  Patient care assumed at 1500. Patient here for evaluation of acute on chronic right upper quadrant abdominal pain. Pain is worse with meals.  She has mild RUQ tenderness without peritoneal finding.  HIDA scan pending.    Contacted nuclear medicine tech regarding patient's HIDA scan. Patient will be unable to obtain her HIDA scan until near 9 PM due to receiving  narcotic pain medications at 3 PM. Discussed with patient delay in study and she is agreeable to wait until 9 PM for her HIDA scan. She is to remain NPO until the scan is completed.  Will start maintenance IVF due to prolonged NPO status.     HIDA scan returned negative. Patient complains of ongoing abdominal pain. No significant tenderness on examination. Patient has not had prior CT scan of her abdomen pelvis to evaluate pain, offer the study the emergency department and she is agreeable. Patient care transferred pending CT scan of the abdomen pelvis.    Quintella Reichert, MD 09/10/19 [phone]

## 2019-09-09 NOTE — ED Notes (Signed)
Per radioology  they are unable to hepatobiliary scan until covid test results, and that the will require u/s done at [facility] to be resulted, and asked to be called when these test have been resulted. RN made tech aware that I would call her with covid test results but could not predict when u/s would result at that was done at [place] and did not see a record of this test being performed.

## 2019-09-10 ENCOUNTER — Emergency Department (HOSPITAL_COMMUNITY): Payer: PPO

## 2019-09-10 DIAGNOSIS — R109 Unspecified abdominal pain: Secondary | ICD-10-CM | POA: Diagnosis not present

## 2019-09-10 MED ORDER — METHYLPREDNISOLONE SODIUM SUCC 125 MG IJ SOLR
125.0000 mg | Freq: Once | INTRAMUSCULAR | Status: AC
  Administered 2019-09-10: 125 mg via INTRAVENOUS
  Filled 2019-09-10: qty 2

## 2019-09-10 MED ORDER — PREDNISONE 20 MG PO TABS: 40.0000 mg | ORAL_TABLET | Freq: Every day | ORAL | 0 refills | Status: DC

## 2019-09-10 MED ORDER — IOHEXOL 300 MG/ML  SOLN
80.0000 mL | Freq: Once | INTRAMUSCULAR | Status: AC | PRN
  Administered 2019-09-10: 80 mL via INTRAVENOUS

## 2019-09-10 MED ORDER — DIPHENHYDRAMINE HCL 50 MG/ML IJ SOLN
12.5000 mg | Freq: Once | INTRAMUSCULAR | Status: AC
  Administered 2019-09-10: 12.5 mg via INTRAVENOUS
  Filled 2019-09-10: qty 1

## 2019-09-10 MED ORDER — SODIUM CHLORIDE (PF) 0.9 % IJ SOLN
INTRAMUSCULAR | Status: AC
  Filled 2019-09-10: qty 50

## 2019-09-10 NOTE — Discharge Instructions (Signed)
You were seen today for abdominal pain.  Your entire work-up is largely reassuring.  While you were here, you did have a reaction to contrast dye.  This needs to be listed as an allergy.  Take prednisone for the next several days.  Use Benadryl as needed.

## 2019-09-10 NOTE — ED Notes (Signed)
Pt had reaction to IV contrast: Hives on arms, redness on chest, and itching of arms, legs, and chest

## 2019-09-10 NOTE — ED Notes (Signed)
HIVES and itching resolved

## 2019-09-10 NOTE — ED Provider Notes (Signed)
1:27 AM  Patient signed out pending CT scan.  In brief has been waiting over 24 hours.  She had a HIDA scan earlier today for chronic abdominal pain which was normal.  She continued to complain of pain.  She got a CT scan.  Unfortunately, the patient did not indicate that she had previously had a CT scan with contrast for which she had hives.  Following CT scan, she developed some itching and hives.  She was given Benadryl and Solu-Medrol.  I was called to the room to evaluate the patient for complaints of tongue swelling.  On my evaluation she denies tongue swelling but states "my mouth is really dry and I feel like my lips are stuck together."  No objective swelling of the lips, tongue, or posterior oropharynx.  No stridor.  No hives or respiratory distress.  We will continue to monitor.  No indication for epinephrine.  3:43 AM Patient now observed for greater than 3 hours following administration of contrast.  No ongoing symptoms or concerns for anaphylaxis.  Discussed continuing several days of prednisone and using Benadryl as needed.  This will be documented as an allergy in her chart.  On repeat exam, she has no oropharyngeal swelling, hives, or any acute abnormalities.   Physical Exam  BP (!) 142/67   Pulse 85   Temp 97.9 F (36.6 C) (Oral)   Resp 16   Ht 1.499 m (4\' 11" )   Wt 60.8 kg   SpO2 97%   BMI 27.06 kg/m   Physical Exam   Awake, alert, oriented, no acute distress No oropharyngeal swelling No hives No respiratory distress ED Course/Procedures     Procedures  CRITICAL CARE Performed by: Merryl Hacker   Total critical care time: 31 minutes  Critical care time was exclusive of separately billable procedures and treating other patients.  Critical care was necessary to treat or prevent imminent or life-threatening deterioration.  Critical care was time spent personally by me on the following activities: development of treatment plan with patient and/or surrogate  as well as nursing, discussions with consultants, evaluation of patient's response to treatment, examination of patient, obtaining history from patient or surrogate, ordering and performing treatments and interventions, ordering and review of laboratory studies, ordering and review of radiographic studies, pulse oximetry and re-evaluation of patient's condition.  MDM   Problem List Items Addressed This Visit    None    Visit Diagnoses    Right upper quadrant abdominal pain    -  Primary   Chronic abdominal pain       Relevant Medications   aspirin EC 81 MG tablet   acetaminophen (TYLENOL) tablet 650 mg (Completed)   methylPREDNISolone sodium succinate (SOLU-MEDROL) 125 mg/2 mL injection 125 mg (Completed)   predniSONE (DELTASONE) 20 MG tablet   Allergic reaction to contrast dye, initial encounter                Merryl Hacker, MD 09/10/19 [phone]

## 2019-09-10 NOTE — ED Notes (Signed)
Patient successfully completed PO challenge: 1 [place] sandwich, juice, water, and PO Tylenol

## 2019-09-19 DIAGNOSIS — L821 Other seborrheic keratosis: Secondary | ICD-10-CM | POA: Diagnosis not present

## 2019-09-19 DIAGNOSIS — L570 Actinic keratosis: Secondary | ICD-10-CM | POA: Diagnosis not present

## 2019-09-19 DIAGNOSIS — L578 Other skin changes due to chronic exposure to nonionizing radiation: Secondary | ICD-10-CM | POA: Diagnosis not present

## 2019-09-21 DIAGNOSIS — M9901 Segmental and somatic dysfunction of cervical region: Secondary | ICD-10-CM | POA: Diagnosis not present

## 2019-09-21 DIAGNOSIS — M9902 Segmental and somatic dysfunction of thoracic region: Secondary | ICD-10-CM | POA: Diagnosis not present

## 2019-09-21 DIAGNOSIS — M4712 Other spondylosis with myelopathy, cervical region: Secondary | ICD-10-CM | POA: Diagnosis not present

## 2019-09-21 DIAGNOSIS — M542 Cervicalgia: Secondary | ICD-10-CM | POA: Diagnosis not present

## 2019-10-05 DIAGNOSIS — M9902 Segmental and somatic dysfunction of thoracic region: Secondary | ICD-10-CM | POA: Diagnosis not present

## 2019-10-05 DIAGNOSIS — M542 Cervicalgia: Secondary | ICD-10-CM | POA: Diagnosis not present

## 2019-10-05 DIAGNOSIS — M9901 Segmental and somatic dysfunction of cervical region: Secondary | ICD-10-CM | POA: Diagnosis not present

## 2019-10-05 DIAGNOSIS — M4712 Other spondylosis with myelopathy, cervical region: Secondary | ICD-10-CM | POA: Diagnosis not present

## 2019-10-19 DIAGNOSIS — M542 Cervicalgia: Secondary | ICD-10-CM | POA: Diagnosis not present

## 2019-10-19 DIAGNOSIS — M9901 Segmental and somatic dysfunction of cervical region: Secondary | ICD-10-CM | POA: Diagnosis not present

## 2019-10-19 DIAGNOSIS — M4712 Other spondylosis with myelopathy, cervical region: Secondary | ICD-10-CM | POA: Diagnosis not present

## 2019-10-19 DIAGNOSIS — M9902 Segmental and somatic dysfunction of thoracic region: Secondary | ICD-10-CM | POA: Diagnosis not present

## 2019-10-25 DIAGNOSIS — K219 Gastro-esophageal reflux disease without esophagitis: Secondary | ICD-10-CM | POA: Diagnosis not present

## 2019-10-25 DIAGNOSIS — K222 Esophageal obstruction: Secondary | ICD-10-CM | POA: Diagnosis not present

## 2019-11-02 DIAGNOSIS — M542 Cervicalgia: Secondary | ICD-10-CM | POA: Diagnosis not present

## 2019-11-02 DIAGNOSIS — M9902 Segmental and somatic dysfunction of thoracic region: Secondary | ICD-10-CM | POA: Diagnosis not present

## 2019-11-02 DIAGNOSIS — M4712 Other spondylosis with myelopathy, cervical region: Secondary | ICD-10-CM | POA: Diagnosis not present

## 2019-11-02 DIAGNOSIS — M9901 Segmental and somatic dysfunction of cervical region: Secondary | ICD-10-CM | POA: Diagnosis not present

## 2019-11-16 DIAGNOSIS — M9902 Segmental and somatic dysfunction of thoracic region: Secondary | ICD-10-CM | POA: Diagnosis not present

## 2019-11-16 DIAGNOSIS — M4712 Other spondylosis with myelopathy, cervical region: Secondary | ICD-10-CM | POA: Diagnosis not present

## 2019-11-16 DIAGNOSIS — M542 Cervicalgia: Secondary | ICD-10-CM | POA: Diagnosis not present

## 2019-11-16 DIAGNOSIS — M9901 Segmental and somatic dysfunction of cervical region: Secondary | ICD-10-CM | POA: Diagnosis not present

## 2019-11-21 ENCOUNTER — Encounter: Payer: Self-pay | Admitting: Family Medicine

## 2019-11-21 NOTE — Progress Notes (Signed)
Established Patient Office Visit  Subjective:  Patient ID: Elizabeth Bradley, female    DOB: 28-Dec-1929  Age: 86 y.o. MRN: [id number]  CC: Patient  is a  86 years old female, She is here for medication follow up. The patient's medications were reviewed and reconciled since the patient's last visit.  History details were provided by the patient. The history appears to be reliable.   Chief Complaint  Patient presents with  . Gastroesophageal Reflux  . Hypertension  . Hypothyroidism  . Hyperlipidemia    HPI Patient presents with mixed hyperlipidemia.  Current treatment includes Zocor.  Compliance with treatment has been good; she takes her medication as directed, maintains her low cholesterol diet, follows up as directed, and maintains her exercise regimen.  She denies experiencing any hypercholesterolemia related symptoms.      Follow up of hypertension and stroke. Her current cardiac medication regimen includes amlodipine, plavix.  She is tolerating the medication well without side effects.  Compliance with treatment has been good; she takes her medication as directed and follows up as directed.      With regard to the other specified hypothyroidism, she is currently taking Synthroid, 25 mcg daily.  She denies any related symptoms.  She reports no symptoms suggestive of adverse medication effect.      Elizabeth Bradley presents with a diagnosis of other sequelae of cerebral infarction.  Associated symptoms include visual changes due to previous cva..    Past Medical History:  Diagnosis Date  . Atrophy of thyroid   . Cerebrovascular accident (CVA) ([place])   . Essential hypertension   . GERD (gastroesophageal reflux disease)   . History of skin cancer   . Ischemic optic neuropathy of left eye   . Low back pain   . Mixed hyperlipidemia   . Osteoarthritis   . Stroke ([place])   . Vitamin D deficiency     Past Surgical History:  Procedure Laterality Date  . ABDOMINAL HYSTERECTOMY    .  SALPINGOOPHORECTOMY    . THYROIDECTOMY      Family History  Problem Relation Age of Onset  . Hypertension Mother   . Heart failure Mother   . Heart failure Father   . Cancer Sister        LUNG  . Heart failure Brother   . Hypertension Brother   . Cancer Brother     Social History   Socioeconomic History  . Marital status: Widowed    Spouse name: Not on file  . Number of children: Not on file  . Years of education: Not on file  . Highest education level: Not on file  Occupational History  . Not on file  Tobacco Use  . Smoking status: Never Smoker  . Smokeless tobacco: Never Used  Substance and Sexual Activity  . Alcohol use: Never  . Drug use: Never  . Sexual activity: Not Currently  Other Topics Concern  . Not on file  Social History Narrative  . Not on file   Social Determinants of Health   Financial Resource Strain:   . Difficulty of Paying Living Expenses:   Food Insecurity:   . Worried About Charity fundraiser in the Last Year:   . Arboriculturist in the Last Year:   Transportation Needs:   . Film/video editor (Medical):   Marland Kitchen Lack of Transportation (Non-Medical):   Physical Activity:   . Days of Exercise per Week:   . Minutes of Exercise per Session:  Stress:   . Feeling of Stress :   Social Connections:   . Frequency of Communication with Friends and Family:   . Frequency of Social Gatherings with Friends and Family:   . Attends Religious Services:   . Active Member of Clubs or Organizations:   . Attends Archivist Meetings:   Marland Kitchen Marital Status:   Intimate Partner Violence:   . Fear of Current or Ex-Partner:   . Emotionally Abused:   Marland Kitchen Physically Abused:   . Sexually Abused:     Outpatient Medications Prior to Visit  Medication Sig Dispense Refill  . aspirin EC 81 MG tablet Take 81 mg by mouth daily.    . celecoxib (CELEBREX) 100 MG capsule Take 100 mg by mouth 2 (two) times daily.    . cetirizine (ZYRTEC) 10 MG chewable  tablet Chew 10 mg by mouth daily.    . Cholecalciferol (VITAMIN D-3) 25 MCG (1000 UT) CAPS Take by mouth.    . clopidogrel (PLAVIX) 75 MG tablet Take 1 tablet by mouth daily.    . famotidine (PEPCID) 20 MG tablet Take 20 mg by mouth daily.    Marland Kitchen levothyroxine (SYNTHROID) 25 MCG tablet Take 1 tablet by mouth daily.    Marland Kitchen amLODipine (NORVASC) 2.5 MG tablet Take 1 tablet by mouth daily.    . simvastatin (ZOCOR) 5 MG tablet Take 1 tablet by mouth every evening.    . predniSONE (DELTASONE) 20 MG tablet Take 2 tablets (40 mg total) by mouth daily. 10 tablet 0   No facility-administered medications prior to visit.    Allergies  Allergen Reactions  . Adhesive [Tape]   . Iodinated Diagnostic Agents Hives and Other (See Comments)    Sneezing and hives 09/09/2019; needs 13 hr prep  . Penicillins   . Prednisone     Alopecia     ROS Review of Systems  Constitutional: Negative for chills, fatigue and fever.  HENT: Negative for congestion, ear pain, rhinorrhea and sore throat.   Respiratory: Negative for cough and shortness of breath.   Cardiovascular: Negative for chest pain.  Gastrointestinal: Negative for abdominal pain, constipation, diarrhea, nausea and vomiting.  Genitourinary: Negative for dysuria and urgency.  Musculoskeletal: Negative for back pain and myalgias.  Neurological: Negative for dizziness, weakness, light-headedness and headaches.  Psychiatric/Behavioral: Negative for dysphoric mood. The patient is not nervous/anxious.       Objective:    Physical Exam  Constitutional: She appears well-developed and well-nourished.  Cardiovascular: Normal rate, regular rhythm and normal heart sounds.  Pulmonary/Chest: Effort normal and breath sounds normal.  Neurological: She is alert.  Psychiatric: She has a normal mood and affect. Her behavior is normal.    BP 122/76 (BP Location: Left Arm, Patient Position: Sitting)   Pulse 78   Temp (!) 96.9 F (36.1 C) (Temporal)   Ht 4\' 10"   (1.473 m)   Wt 131 lb (59.4 kg)   SpO2 96%   BMI 27.38 kg/m  Wt Readings from Last 3 Encounters:  11/22/19 131 lb (59.4 kg)  09/09/19 134 lb (60.8 kg)     Health Maintenance Due  Topic Date Due  . TETANUS/TDAP  Never done  . PNA vac Low Risk Adult (2 of 2 - PPSV23) 05/26/2018    There are no preventive care reminders to display for this patient.  No results found for: TSH Lab Results  Component Value Date   WBC 5.0 09/09/2019   HGB 11.8 (L) 09/09/2019   HCT 36.2  09/09/2019   MCV 92.3 09/09/2019   PLT 200 09/09/2019   Lab Results  Component Value Date   NA 141 09/09/2019   K 3.8 09/09/2019   CO2 23 09/09/2019   GLUCOSE 106 (H) 09/09/2019   BUN 19 09/09/2019   CREATININE 1.14 (H) 09/09/2019   BILITOT 0.5 09/09/2019   ALKPHOS 75 09/09/2019   AST 20 09/09/2019   ALT 23 09/09/2019   PROT 6.7 09/09/2019   ALBUMIN 4.0 09/09/2019   CALCIUM 9.4 09/09/2019   ANIONGAP 10 09/09/2019   No results found for: CHOL No results found for: HDL No results found for: LDLCALC No results found for: TRIG No results found for: CHOLHDL No results found for: HGBA1C    Assessment & Plan:   Problem List Items Addressed This Visit      Cardiovascular and Mediastinum   Essential hypertension    Well controlled.  No changes to medicines.  Continue to work on eating a healthy diet and exercise.  Labs drawn today.       Relevant Medications   simvastatin (ZOCOR) 5 MG tablet   amLODipine (NORVASC) 2.5 MG tablet     Other   Mixed hyperlipidemia - Primary    Well controlled.  No changes to medicines.  Continue to work on eating a healthy diet and exercise.  Labs drawn today.       Relevant Medications   simvastatin (ZOCOR) 5 MG tablet   amLODipine (NORVASC) 2.5 MG tablet   Other Relevant Orders   CBC with Differential/Platelet   Comprehensive metabolic panel   Lipid panel       Meds ordered this encounter  Medications  . simvastatin (ZOCOR) 5 MG tablet    Sig:  Take 1 tablet (5 mg total) by mouth every evening.    Dispense:  90 tablet    Refill:  0    Order Specific Question:   Supervising Provider    AnswerRochel Brome [id number]  . amLODipine (NORVASC) 2.5 MG tablet    Sig: Take 1 tablet (2.5 mg total) by mouth daily.    Dispense:  90 tablet    Refill:  0    Order Specific Question:   Supervising Provider    Answer:   Shelton Silvas   Hypothyroidism - continue synthroid.  Follow-up: Return in about 3 months (around 02/22/2020).    Ivy Lynn, NP

## 2019-11-22 ENCOUNTER — Other Ambulatory Visit: Payer: Self-pay

## 2019-11-22 ENCOUNTER — Other Ambulatory Visit: Payer: Self-pay | Admitting: Nurse Practitioner

## 2019-11-22 ENCOUNTER — Encounter: Payer: Self-pay | Admitting: Nurse Practitioner

## 2019-11-22 ENCOUNTER — Ambulatory Visit (INDEPENDENT_AMBULATORY_CARE_PROVIDER_SITE_OTHER): Payer: PPO | Admitting: Nurse Practitioner

## 2019-11-22 VITALS — BP 122/76 | HR 78 | Temp 96.9°F | Ht <= 58 in | Wt 131.0 lb

## 2019-11-22 DIAGNOSIS — E038 Other specified hypothyroidism: Secondary | ICD-10-CM

## 2019-11-22 DIAGNOSIS — I10 Essential (primary) hypertension: Secondary | ICD-10-CM

## 2019-11-22 DIAGNOSIS — R7989 Other specified abnormal findings of blood chemistry: Secondary | ICD-10-CM | POA: Diagnosis not present

## 2019-11-22 DIAGNOSIS — E782 Mixed hyperlipidemia: Secondary | ICD-10-CM | POA: Diagnosis not present

## 2019-11-22 DIAGNOSIS — Z13220 Encounter for screening for lipoid disorders: Secondary | ICD-10-CM | POA: Diagnosis not present

## 2019-11-22 DIAGNOSIS — R6889 Other general symptoms and signs: Secondary | ICD-10-CM | POA: Diagnosis not present

## 2019-11-22 MED ORDER — AMLODIPINE BESYLATE 2.5 MG PO TABS: 2.5000 mg | ORAL_TABLET | Freq: Every day | ORAL | 0 refills | Status: DC

## 2019-11-22 MED ORDER — SIMVASTATIN 5 MG PO TABS: 5.0000 mg | ORAL_TABLET | Freq: Every evening | ORAL | 0 refills | Status: DC

## 2019-11-22 NOTE — Patient Instructions (Addendum)
Mixed hyperlipidemia Well controlled.  No changes to medicines.  Continue to work on eating a healthy diet and exercise.  Labs drawn today.   Essential hypertension Well controlled.  No changes to medicines.  Continue to work on eating a healthy diet and exercise.  Labs drawn today.   High Cholesterol  High cholesterol is a condition in which the blood has high levels of a white, waxy, fat-like substance (cholesterol). The human body needs small amounts of cholesterol. The liver makes all the cholesterol that the body needs. Extra (excess) cholesterol comes from the food that we eat. Cholesterol is carried from the liver by the blood through the blood vessels. If you have high cholesterol, deposits (plaques) may build up on the walls of your blood vessels (arteries). Plaques make the arteries narrower and stiffer. Cholesterol plaques increase your risk for heart attack and stroke. Work with your health care provider to keep your cholesterol levels in a healthy range. What increases the risk? This condition is more likely to develop in people who:  Eat foods that are high in animal fat (saturated fat) or cholesterol.  Are overweight.  Are not getting enough exercise.  Have a family history of high cholesterol. What are the signs or symptoms? There are no symptoms of this condition. How is this diagnosed? This condition may be diagnosed from the results of a blood test.  If you are older than age 25, your health care provider may check your cholesterol every 4-6 years.  You may be checked more often if you already have high cholesterol or other risk factors for heart disease. The blood test for cholesterol measures:  "Bad" cholesterol (LDL cholesterol). This is the main type of cholesterol that causes heart disease. The desired level for LDL is less than 100.  "Good" cholesterol (HDL cholesterol). This type helps to protect against heart disease by cleaning the arteries and  carrying the LDL away. The desired level for HDL is 60 or higher.  Triglycerides. These are fats that the body can store or burn for energy. The desired number for triglycerides is lower than 150.  Total cholesterol. This is a measure of the total amount of cholesterol in your blood, including LDL cholesterol, HDL cholesterol, and triglycerides. A healthy number is less than 200. How is this treated? This condition is treated with diet changes, lifestyle changes, and medicines. Diet changes  This may include eating more whole grains, fruits, vegetables, nuts, and fish.  This may also include cutting back on red meat and foods that have a lot of added sugar. Lifestyle changes  Changes may include getting at least 40 minutes of aerobic exercise 3 times a week. Aerobic exercises include walking, biking, and swimming. Aerobic exercise along with a healthy diet can help you maintain a healthy weight.  Changes may also include quitting smoking. Medicines  Medicines are usually given if diet and lifestyle changes have failed to reduce your cholesterol to healthy levels.  Your health care provider may prescribe a statin medicine. Statin medicines have been shown to reduce cholesterol, which can reduce the risk of heart disease. Follow these instructions at home: Eating and drinking If told by your health care provider:  Eat chicken (without skin), fish, veal, shellfish, ground [place] breast, and round or loin cuts of red meat.  Do not eat fried foods or fatty meats, such as hot dogs and salami.  Eat plenty of fruits, such as apples.  Eat plenty of vegetables, such as broccoli, potatoes,  and carrots.  Eat beans, peas, and lentils.  Eat grains such as barley, rice, couscous, and bulgur wheat.  Eat pasta without cream sauces.  Use skim or nonfat milk, and eat low-fat or nonfat yogurt and cheeses.  Do not eat or drink whole milk, cream, ice cream, egg yolks, or hard cheeses.  Do not  eat stick margarine or tub margarines that contain trans fats (also called partially hydrogenated oils).  Do not eat saturated tropical oils, such as coconut oil and palm oil.  Do not eat cakes, cookies, crackers, or other baked goods that contain trans fats.  General instructions  Exercise as directed by your health care provider. Increase your activity level with activities such as gardening, walking, and taking the stairs.  Take over-the-counter and prescription medicines only as told by your health care provider.  Do not use any products that contain nicotine or tobacco, such as cigarettes and e-cigarettes. If you need help quitting, ask your health care provider.  Keep all follow-up visits as told by your health care provider. This is important. Contact a health care provider if:  You are struggling to maintain a healthy diet or weight.  You need help to start on an exercise program.  You need help to stop smoking. Get help right away if:  You have chest pain.  You have trouble breathing. This information is not intended to replace advice given to you by your health care provider. Make sure you discuss any questions you have with your health care provider. Document Revised: 08/27/2017 Document Reviewed: 02/22/2016 Elsevier Patient Education  [employer].  Hypertension, Adult Hypertension is another name for high blood pressure. High blood pressure forces your heart to work harder to pump blood. This can cause problems over time. There are two numbers in a blood pressure reading. There is a top number (systolic) over a bottom number (diastolic). It is best to have a blood pressure that is below 120/80. Healthy choices can help lower your blood pressure, or you may need medicine to help lower it. What are the causes? The cause of this condition is not known. Some conditions may be related to high blood pressure. What increases the risk?  Smoking.  Having type 2  diabetes mellitus, high cholesterol, or both.  Not getting enough exercise or physical activity.  Being overweight.  Having too much fat, sugar, calories, or salt (sodium) in your diet.  Drinking too much alcohol.  Having long-term (chronic) kidney disease.  Having a family history of high blood pressure.  Age. Risk increases with age.  Race. You may be at higher risk if you are African American.  Gender. Men are at higher risk than women before age 29. After age 47, women are at higher risk than men.  Having obstructive sleep apnea.  Stress. What are the signs or symptoms?  High blood pressure may not cause symptoms. Very high blood pressure (hypertensive crisis) may cause: ? Headache. ? Feelings of worry or nervousness (anxiety). ? Shortness of breath. ? Nosebleed. ? A feeling of being sick to your stomach (nausea). ? Throwing up (vomiting). ? Changes in how you see. ? Very bad chest pain. ? Seizures. How is this treated?  This condition is treated by making healthy lifestyle changes, such as: ? Eating healthy foods. ? Exercising more. ? Drinking less alcohol.  Your health care provider may prescribe medicine if lifestyle changes are not enough to get your blood pressure under control, and if: ? Your top  number is above 130. ? Your bottom number is above 80.  Your personal target blood pressure may vary. Follow these instructions at home: Eating and drinking   If told, follow the DASH eating plan. To follow this plan: ? Fill one half of your plate at each meal with fruits and vegetables. ? Fill one fourth of your plate at each meal with whole grains. Whole grains include whole-wheat pasta, brown rice, and whole-grain bread. ? Eat or drink low-fat dairy products, such as skim milk or low-fat yogurt. ? Fill one fourth of your plate at each meal with low-fat (lean) proteins. Low-fat proteins include fish, chicken without skin, eggs, beans, and tofu. ? Avoid  fatty meat, cured and processed meat, or chicken with skin. ? Avoid pre-made or processed food.  Eat less than 1,500 mg of salt each day.  Do not drink alcohol if: ? Your doctor tells you not to drink. ? You are pregnant, may be pregnant, or are planning to become pregnant.  If you drink alcohol: ? Limit how much you use to:  0-1 drink a day for women.  0-2 drinks a day for men. ? Be aware of how much alcohol is in your drink. In the [place] one drink equals one 12 oz bottle of beer (355 mL), one 5 oz glass of wine (148 mL), or one 1 oz glass of hard liquor (44 mL). Lifestyle   Work with your doctor to stay at a healthy weight or to lose weight. Ask your doctor what the best weight is for you.  Get at least 30 minutes of exercise most days of the week. This may include walking, swimming, or biking.  Get at least 30 minutes of exercise that strengthens your muscles (resistance exercise) at least 3 days a week. This may include lifting weights or doing Pilates.  Do not use any products that contain nicotine or tobacco, such as cigarettes, e-cigarettes, and chewing tobacco. If you need help quitting, ask your doctor.  Check your blood pressure at home as told by your doctor.  Keep all follow-up visits as told by your doctor. This is important. Medicines  Take over-the-counter and prescription medicines only as told by your doctor. Follow directions carefully.  Do not skip doses of blood pressure medicine. The medicine does not work as well if you skip doses. Skipping doses also puts you at risk for problems.  Ask your doctor about side effects or reactions to medicines that you should watch for. Contact a doctor if you:  Think you are having a reaction to the medicine you are taking.  Have headaches that keep coming back (recurring).  Feel dizzy.  Have swelling in your ankles.  Have trouble with your vision. Get help right away if you:  Get a very bad headache.  Start  to feel mixed up (confused).  Feel weak or numb.  Feel faint.  Have very bad pain in your: ? Chest. ? Belly (abdomen).  Throw up more than once.  Have trouble breathing. Summary  Hypertension is another name for high blood pressure.  High blood pressure forces your heart to work harder to pump blood.  For most people, a normal blood pressure is less than 120/80.  Making healthy choices can help lower blood pressure. If your blood pressure does not get lower with healthy choices, you may need to take medicine. This information is not intended to replace advice given to you by your health care provider. Make sure you discuss  any questions you have with your health care provider. Document Revised: 05/04/2018 Document Reviewed: 05/04/2018 Elsevier Patient Education  2020 [employer].

## 2019-11-22 NOTE — Assessment & Plan Note (Signed)
Well controlled.  ?No changes to medicines.  ?Continue to work on eating a healthy diet and exercise.  ?Labs drawn today.  ?

## 2019-11-23 LAB — COMPREHENSIVE METABOLIC PANEL
ALT: 19 IU/L (ref 0–32)
AST: 21 IU/L (ref 0–40)
Albumin/Globulin Ratio: 1.8 (ref 1.2–2.2)
Albumin: 4 g/dL (ref 3.6–4.6)
Alkaline Phosphatase: 80 IU/L (ref 39–117)
BUN/Creatinine Ratio: 22 (ref 12–28)
BUN: 29 mg/dL — ABNORMAL HIGH (ref 8–27)
Bilirubin Total: 0.5 mg/dL (ref 0.0–1.2)
CO2: 21 mmol/L (ref 20–29)
Calcium: 10.2 mg/dL (ref 8.7–10.3)
Chloride: 111 mmol/L — ABNORMAL HIGH (ref 96–106)
Creatinine, Ser: 1.34 mg/dL — ABNORMAL HIGH (ref 0.57–1.00)
GFR calc Af Amer: 41 mL/min/{1.73_m2} — ABNORMAL LOW (ref >59)
GFR calc non Af Amer: 36 mL/min/{1.73_m2} — ABNORMAL LOW (ref >59)
Globulin, Total: 2.2 g/dL (ref 1.5–4.5)
Glucose: 88 mg/dL (ref 65–99)
Potassium: 4.4 mmol/L (ref 3.5–5.2)
Sodium: 146 mmol/L — ABNORMAL HIGH (ref 134–144)
Total Protein: 6.2 g/dL (ref 6.0–8.5)

## 2019-11-23 LAB — CBC WITH DIFFERENTIAL/PLATELET
Basophils Absolute: 0 10*3/uL (ref 0.0–0.2)
Basos: 1 %
EOS (ABSOLUTE): 0.4 10*3/uL (ref 0.0–0.4)
Eos: 7 %
Hematocrit: 37.3 % (ref 34.0–46.6)
Hemoglobin: 12.2 g/dL (ref 11.1–15.9)
Immature Grans (Abs): 0 10*3/uL (ref 0.0–0.1)
Immature Granulocytes: 0 %
Lymphocytes Absolute: 2.2 10*3/uL (ref 0.7–3.1)
Lymphs: 39 %
MCH: 30.6 pg (ref 26.6–33.0)
MCHC: 32.7 g/dL (ref 31.5–35.7)
MCV: 94 fL (ref 79–97)
Monocytes Absolute: 0.6 10*3/uL (ref 0.1–0.9)
Monocytes: 11 %
Neutrophils Absolute: 2.4 10*3/uL (ref 1.4–7.0)
Neutrophils: 42 %
Platelets: 209 10*3/uL (ref 150–450)
RBC: 3.99 x10E6/uL (ref 3.77–5.28)
RDW: 11.8 % (ref 11.7–15.4)
WBC: 5.7 10*3/uL (ref 3.4–10.8)

## 2019-11-23 LAB — LIPID PANEL W/O CHOL/HDL RATIO
Cholesterol, Total: 248 mg/dL — ABNORMAL HIGH (ref 100–199)
HDL: 90 mg/dL (ref >39)
LDL Chol Calc (NIH): 144 mg/dL — ABNORMAL HIGH (ref 0–99)
Triglycerides: 83 mg/dL (ref 0–149)
VLDL Cholesterol Cal: 14 mg/dL (ref 5–40)

## 2019-11-23 LAB — CARDIOVASCULAR RISK ASSESSMENT

## 2019-11-30 DIAGNOSIS — M542 Cervicalgia: Secondary | ICD-10-CM | POA: Diagnosis not present

## 2019-11-30 DIAGNOSIS — M9901 Segmental and somatic dysfunction of cervical region: Secondary | ICD-10-CM | POA: Diagnosis not present

## 2019-11-30 DIAGNOSIS — M4712 Other spondylosis with myelopathy, cervical region: Secondary | ICD-10-CM | POA: Diagnosis not present

## 2019-11-30 DIAGNOSIS — M9902 Segmental and somatic dysfunction of thoracic region: Secondary | ICD-10-CM | POA: Diagnosis not present

## 2019-12-01 ENCOUNTER — Other Ambulatory Visit: Payer: Self-pay

## 2019-12-10 ENCOUNTER — Encounter: Payer: Self-pay | Admitting: Nurse Practitioner

## 2019-12-10 DIAGNOSIS — E038 Other specified hypothyroidism: Secondary | ICD-10-CM | POA: Insufficient documentation

## 2019-12-13 DIAGNOSIS — H43393 Other vitreous opacities, bilateral: Secondary | ICD-10-CM | POA: Diagnosis not present

## 2019-12-13 DIAGNOSIS — Z961 Presence of intraocular lens: Secondary | ICD-10-CM | POA: Diagnosis not present

## 2019-12-13 DIAGNOSIS — H4720 Unspecified optic atrophy: Secondary | ICD-10-CM | POA: Diagnosis not present

## 2019-12-13 DIAGNOSIS — H04123 Dry eye syndrome of bilateral lacrimal glands: Secondary | ICD-10-CM | POA: Diagnosis not present

## 2019-12-14 DIAGNOSIS — M9902 Segmental and somatic dysfunction of thoracic region: Secondary | ICD-10-CM | POA: Diagnosis not present

## 2019-12-14 DIAGNOSIS — M4712 Other spondylosis with myelopathy, cervical region: Secondary | ICD-10-CM | POA: Diagnosis not present

## 2019-12-14 DIAGNOSIS — M542 Cervicalgia: Secondary | ICD-10-CM | POA: Diagnosis not present

## 2019-12-14 DIAGNOSIS — M9901 Segmental and somatic dysfunction of cervical region: Secondary | ICD-10-CM | POA: Diagnosis not present

## 2019-12-28 DIAGNOSIS — M4712 Other spondylosis with myelopathy, cervical region: Secondary | ICD-10-CM | POA: Diagnosis not present

## 2019-12-28 DIAGNOSIS — M9901 Segmental and somatic dysfunction of cervical region: Secondary | ICD-10-CM | POA: Diagnosis not present

## 2019-12-28 DIAGNOSIS — M9902 Segmental and somatic dysfunction of thoracic region: Secondary | ICD-10-CM | POA: Diagnosis not present

## 2019-12-28 DIAGNOSIS — M542 Cervicalgia: Secondary | ICD-10-CM | POA: Diagnosis not present

## 2020-01-02 DIAGNOSIS — R9431 Abnormal electrocardiogram [ECG] [EKG]: Secondary | ICD-10-CM | POA: Diagnosis not present

## 2020-01-02 DIAGNOSIS — E782 Mixed hyperlipidemia: Secondary | ICD-10-CM | POA: Diagnosis not present

## 2020-01-02 DIAGNOSIS — Z7689 Persons encountering health services in other specified circumstances: Secondary | ICD-10-CM | POA: Diagnosis not present

## 2020-01-02 DIAGNOSIS — I10 Essential (primary) hypertension: Secondary | ICD-10-CM | POA: Diagnosis not present

## 2020-01-02 DIAGNOSIS — R079 Chest pain, unspecified: Secondary | ICD-10-CM | POA: Diagnosis not present

## 2020-01-11 DIAGNOSIS — M9902 Segmental and somatic dysfunction of thoracic region: Secondary | ICD-10-CM | POA: Diagnosis not present

## 2020-01-11 DIAGNOSIS — M4712 Other spondylosis with myelopathy, cervical region: Secondary | ICD-10-CM | POA: Diagnosis not present

## 2020-01-11 DIAGNOSIS — M9901 Segmental and somatic dysfunction of cervical region: Secondary | ICD-10-CM | POA: Diagnosis not present

## 2020-01-11 DIAGNOSIS — M542 Cervicalgia: Secondary | ICD-10-CM | POA: Diagnosis not present

## 2020-01-17 DIAGNOSIS — R002 Palpitations: Secondary | ICD-10-CM | POA: Diagnosis not present

## 2020-01-17 DIAGNOSIS — R9431 Abnormal electrocardiogram [ECG] [EKG]: Secondary | ICD-10-CM | POA: Diagnosis not present

## 2020-01-17 DIAGNOSIS — R079 Chest pain, unspecified: Secondary | ICD-10-CM | POA: Diagnosis not present

## 2020-01-19 DIAGNOSIS — R079 Chest pain, unspecified: Secondary | ICD-10-CM | POA: Diagnosis not present

## 2020-01-19 DIAGNOSIS — R9431 Abnormal electrocardiogram [ECG] [EKG]: Secondary | ICD-10-CM | POA: Diagnosis not present

## 2020-02-07 DIAGNOSIS — I471 Supraventricular tachycardia: Secondary | ICD-10-CM | POA: Insufficient documentation

## 2020-02-15 ENCOUNTER — Other Ambulatory Visit: Payer: Self-pay | Admitting: Family Medicine

## 2020-02-15 DIAGNOSIS — M9901 Segmental and somatic dysfunction of cervical region: Secondary | ICD-10-CM | POA: Diagnosis not present

## 2020-02-15 DIAGNOSIS — M4712 Other spondylosis with myelopathy, cervical region: Secondary | ICD-10-CM | POA: Diagnosis not present

## 2020-02-15 DIAGNOSIS — M542 Cervicalgia: Secondary | ICD-10-CM | POA: Diagnosis not present

## 2020-02-15 DIAGNOSIS — I10 Essential (primary) hypertension: Secondary | ICD-10-CM

## 2020-02-15 DIAGNOSIS — M9902 Segmental and somatic dysfunction of thoracic region: Secondary | ICD-10-CM | POA: Diagnosis not present

## 2020-02-15 MED ORDER — AMLODIPINE BESYLATE 2.5 MG PO TABS: 2.5000 mg | ORAL_TABLET | Freq: Every day | ORAL | 0 refills | Status: DC

## 2020-02-16 DIAGNOSIS — E782 Mixed hyperlipidemia: Secondary | ICD-10-CM | POA: Diagnosis not present

## 2020-02-16 DIAGNOSIS — I10 Essential (primary) hypertension: Secondary | ICD-10-CM | POA: Diagnosis not present

## 2020-02-16 DIAGNOSIS — I471 Supraventricular tachycardia: Secondary | ICD-10-CM | POA: Diagnosis not present

## 2020-02-16 DIAGNOSIS — R9431 Abnormal electrocardiogram [ECG] [EKG]: Secondary | ICD-10-CM | POA: Diagnosis not present

## 2020-02-16 DIAGNOSIS — R079 Chest pain, unspecified: Secondary | ICD-10-CM | POA: Diagnosis not present

## 2020-02-28 ENCOUNTER — Ambulatory Visit: Payer: PPO | Admitting: Family Medicine

## 2020-03-19 DIAGNOSIS — L570 Actinic keratosis: Secondary | ICD-10-CM | POA: Diagnosis not present

## 2020-03-19 DIAGNOSIS — L820 Inflamed seborrheic keratosis: Secondary | ICD-10-CM | POA: Diagnosis not present

## 2020-03-19 DIAGNOSIS — L578 Other skin changes due to chronic exposure to nonionizing radiation: Secondary | ICD-10-CM | POA: Diagnosis not present

## 2020-03-19 DIAGNOSIS — L821 Other seborrheic keratosis: Secondary | ICD-10-CM | POA: Diagnosis not present

## 2020-03-20 DIAGNOSIS — H469 Unspecified optic neuritis: Secondary | ICD-10-CM | POA: Diagnosis not present

## 2020-03-21 DIAGNOSIS — I471 Supraventricular tachycardia: Secondary | ICD-10-CM | POA: Diagnosis not present

## 2020-03-21 DIAGNOSIS — I10 Essential (primary) hypertension: Secondary | ICD-10-CM | POA: Diagnosis not present

## 2020-03-21 DIAGNOSIS — R9431 Abnormal electrocardiogram [ECG] [EKG]: Secondary | ICD-10-CM | POA: Diagnosis not present

## 2020-03-21 DIAGNOSIS — E782 Mixed hyperlipidemia: Secondary | ICD-10-CM | POA: Diagnosis not present

## 2020-03-23 DIAGNOSIS — J029 Acute pharyngitis, unspecified: Secondary | ICD-10-CM | POA: Diagnosis not present

## 2020-04-01 DIAGNOSIS — K219 Gastro-esophageal reflux disease without esophagitis: Secondary | ICD-10-CM | POA: Diagnosis not present

## 2020-04-03 ENCOUNTER — Ambulatory Visit (INDEPENDENT_AMBULATORY_CARE_PROVIDER_SITE_OTHER): Payer: PPO | Admitting: Family Medicine

## 2020-04-03 ENCOUNTER — Other Ambulatory Visit: Payer: Self-pay

## 2020-04-03 ENCOUNTER — Encounter: Payer: Self-pay | Admitting: Family Medicine

## 2020-04-03 VITALS — BP 122/62 | HR 74 | Temp 95.8°F | Ht 59.0 in | Wt 130.0 lb

## 2020-04-03 DIAGNOSIS — E038 Other specified hypothyroidism: Secondary | ICD-10-CM

## 2020-04-03 DIAGNOSIS — I10 Essential (primary) hypertension: Secondary | ICD-10-CM

## 2020-04-03 DIAGNOSIS — E782 Mixed hyperlipidemia: Secondary | ICD-10-CM

## 2020-04-03 DIAGNOSIS — R1011 Right upper quadrant pain: Secondary | ICD-10-CM | POA: Diagnosis not present

## 2020-04-03 DIAGNOSIS — K219 Gastro-esophageal reflux disease without esophagitis: Secondary | ICD-10-CM | POA: Diagnosis not present

## 2020-04-03 DIAGNOSIS — R112 Nausea with vomiting, unspecified: Secondary | ICD-10-CM | POA: Diagnosis not present

## 2020-04-03 DIAGNOSIS — R1310 Dysphagia, unspecified: Secondary | ICD-10-CM | POA: Diagnosis not present

## 2020-04-03 NOTE — Progress Notes (Signed)
Established Patient Office Visit  Subjective:  Patient ID: Elizabeth Bradley, female    DOB: [birth year]  Age: 86 y.o. MRN: [id number]  CC: Patient  is a  86 years old female, She is here for medication follow up. The patient's medications were reviewed and reconciled since the patient's last visit.  History details were provided by the patient. The history appears to be reliable.   Chief Complaint  Patient presents with  . Hypothyroidism  . Hypertension  . Hyperlipidemia    HPI Patient presents with mixed hyperlipidemia.  Current treatment includes Zocor.  Compliance with treatment has been good; she takes her medication as directed, maintains her low cholesterol diet, follows up as directed, and maintains her exercise regimen.  She denies experiencing any hypercholesterolemia related symptoms.      Follow up of hypertension and stroke. Her current cardiac medication regimen includes amlodipine, plavix, zocor.  She is tolerating the medication well without side effects.  Compliance with treatment has been good; she takes her medication as directed and follows up as directed.      With regard to the other specified hypothyroidism, she is currently taking Synthroid, 25 mcg daily.  She denies any related symptoms.  She reports no symptoms suggestive of adverse medication effect.      Denyla presents with a diagnosis of other sequelae of cerebral infarction.  Associated symptoms include visual changes due to previous cva..    Abdominal pain- RUQ-nausea, vomiting been present for months triggered by eating high fat foods.  Patient had gallbladder ultrasound, biliary scan, and CT scan all of which were normal.  She is seeing Dr. Lyda Jester who is working on finding a diagnosis.  Past Medical History:  Diagnosis Date  . Atrophy of thyroid   . Cerebrovascular accident (CVA) ([place])   . Essential hypertension   . GERD (gastroesophageal reflux disease)   . History of skin cancer   . Ischemic optic  neuropathy of left eye   . Low back pain   . Mixed hyperlipidemia   . Osteoarthritis   . Stroke ([place])   . Vitamin D deficiency     Past Surgical History:  Procedure Laterality Date  . ABDOMINAL HYSTERECTOMY    . SALPINGOOPHORECTOMY    . THYROIDECTOMY      Family History  Problem Relation Age of Onset  . Hypertension Mother   . Heart failure Mother   . Heart failure Father   . Cancer Sister        LUNG  . Heart failure Brother   . Hypertension Brother   . Cancer Brother     Social History   Socioeconomic History  . Marital status: Widowed    Spouse name: Not on file  . Number of children: Not on file  . Years of education: Not on file  . Highest education level: Not on file  Occupational History  . Not on file  Tobacco Use  . Smoking status: Never Smoker  . Smokeless tobacco: Never Used  Substance and Sexual Activity  . Alcohol use: Never  . Drug use: Never  . Sexual activity: Not Currently  Other Topics Concern  . Not on file  Social History Narrative  . Not on file   Social Determinants of Health   Financial Resource Strain:   . Difficulty of Paying Living Expenses:   Food Insecurity:   . Worried About Charity fundraiser in the Last Year:   . [place] in the  Last Year:   Transportation Needs:   . Film/video editor (Medical):   Marland Kitchen Lack of Transportation (Non-Medical):   Physical Activity:   . Days of Exercise per Week:   . Minutes of Exercise per Session:   Stress:   . Feeling of Stress :   Social Connections:   . Frequency of Communication with Friends and Family:   . Frequency of Social Gatherings with Friends and Family:   . Attends Religious Services:   . Active Member of Clubs or Organizations:   . Attends Archivist Meetings:   Marland Kitchen Marital Status:   Intimate Partner Violence:   . Fear of Current or Ex-Partner:   . Emotionally Abused:   Marland Kitchen Physically Abused:   . Sexually Abused:     Outpatient Medications Prior to  Visit  Medication Sig Dispense Refill  . amLODipine (NORVASC) 2.5 MG tablet Take 1 tablet (2.5 mg total) by mouth daily. 90 tablet 0  . aspirin EC 81 MG tablet Take 81 mg by mouth daily.    . celecoxib (CELEBREX) 100 MG capsule Take 100 mg by mouth 2 (two) times daily.    . cetirizine (ZYRTEC) 10 MG chewable tablet Chew 10 mg by mouth daily.    . Cholecalciferol (VITAMIN D-3) 25 MCG (1000 UT) CAPS Take by mouth.    . clopidogrel (PLAVIX) 75 MG tablet Take 1 tablet by mouth daily.    . famotidine (PEPCID) 20 MG tablet Take 20 mg by mouth daily.    Marland Kitchen levothyroxine (SYNTHROID) 25 MCG tablet TAKE 1 TABLET BY MOUTH ONCE DAILY. 90 tablet 1  . simvastatin (ZOCOR) 5 MG tablet Take 1 tablet (5 mg total) by mouth every evening. 90 tablet 0   No facility-administered medications prior to visit.    Allergies  Allergen Reactions  . Adhesive [Tape]   . Iodinated Diagnostic Agents Hives and Other (See Comments)    Sneezing and hives 09/09/2019; needs 13 hr prep  . Penicillins   . Prednisone     Alopecia     ROS Review of Systems  Constitutional: Negative for chills, fatigue and fever.  HENT: Negative for congestion, ear pain, rhinorrhea and sore throat.   Respiratory: Negative for cough and shortness of breath.   Cardiovascular: Negative for chest pain.  Gastrointestinal: Positive for abdominal pain (RUQ), nausea and vomiting. Negative for constipation and diarrhea.  Genitourinary: Negative for dysuria and urgency.  Musculoskeletal: Negative for back pain and myalgias.  Neurological: Negative for dizziness, weakness, light-headedness and headaches.  Psychiatric/Behavioral: Negative for dysphoric mood. The patient is not nervous/anxious.       Objective:    Physical Exam Constitutional:      Appearance: She is well-developed.  Cardiovascular:     Rate and Rhythm: Normal rate and regular rhythm.     Heart sounds: Normal heart sounds.  Pulmonary:     Effort: Pulmonary effort is normal.       Breath sounds: Normal breath sounds.  Abdominal:     General: Bowel sounds are normal.     Palpations: Abdomen is soft.     Tenderness: There is abdominal tenderness in the right upper quadrant. There is no guarding or rebound.    Neurological:     Mental Status: She is alert and oriented to person, place, and time.  Psychiatric:        Mood and Affect: Mood normal.        Behavior: Behavior normal.     BP (!) 122/62  Pulse 74   Temp (!) 95.8 F (35.4 C)   Ht 4\' 11"  (1.499 m)   Wt 130 lb (59 kg)   SpO2 99%   BMI 26.26 kg/m  Wt Readings from Last 3 Encounters:  04/03/20 130 lb (59 kg)  11/22/19 131 lb (59.4 kg)  09/09/19 134 lb (60.8 kg)     Health Maintenance Due  Topic Date Due  . COVID-19 Vaccine (1) Never done  . TETANUS/TDAP  Never done  . PNA vac Low Risk Adult (2 of 2 - PPSV23) 05/26/2018  . INFLUENZA VACCINE  04/07/2020    There are no preventive care reminders to display for this patient.  No results found for: TSH Lab Results  Component Value Date   WBC 5.8 04/03/2020   HGB 11.6 04/03/2020   HCT 34.3 04/03/2020   MCV 87 04/03/2020   PLT 202 04/03/2020   Lab Results  Component Value Date   NA 142 04/03/2020   K 3.6 04/03/2020   CO2 23 04/03/2020   GLUCOSE 89 04/03/2020   BUN 19 04/03/2020   CREATININE 1.18 (H) 04/03/2020   BILITOT 0.8 04/03/2020   ALKPHOS 75 04/03/2020   AST 21 04/03/2020   ALT 14 04/03/2020   PROT 6.3 04/03/2020   ALBUMIN 4.2 04/03/2020   CALCIUM 9.4 04/03/2020   ANIONGAP 10 09/09/2019   Lab Results  Component Value Date   CHOL 252 (H) 04/03/2020   Lab Results  Component Value Date   HDL 97 04/03/2020   Lab Results  Component Value Date   LDLCALC 140 (H) 04/03/2020   Lab Results  Component Value Date   TRIG 88 04/03/2020   Lab Results  Component Value Date   CHOLHDL 2.6 04/03/2020   No results found for: HGBA1C    Assessment & Plan:   Problem List Items Addressed This Visit       Cardiovascular and Mediastinum   Essential hypertension - Primary Well controlled.  No changes to medicines.  Continue to work on eating a healthy diet and exercise.  Labs drawn today.    Relevant Orders   Comprehensive metabolic panel (Completed)   CBC with Differential/Platelet (Completed)     Endocrine   Secondary hypothyroidism   Relevant Orders   The current medical regimen is effective;  continue present plan and medications.     Other   Mixed hyperlipidemia Poorly controlled.  No changes to medicines.  Continue to work on eating a healthy diet and exercise.  Labs drawn today.    Relevant Orders   Lipid panel (Completed)    Other Visit Diagnoses    RUQ abdominal pain    - keep follow up with Dr. Althea Charon.   Non-intractable vomiting with nausea, unspecified vomiting type       Relevant Orders   Lipase (Completed)   Amylase (Completed)     Follow-up: Return in about 3 months (around 07/04/2020) for nonfasting.Rochel Brome, MD

## 2020-04-04 LAB — LIPID PANEL
Chol/HDL Ratio: 2.6 ratio (ref 0.0–4.4)
Cholesterol, Total: 252 mg/dL — ABNORMAL HIGH (ref 100–199)
HDL: 97 mg/dL (ref >39)
LDL Chol Calc (NIH): 140 mg/dL — ABNORMAL HIGH (ref 0–99)
Triglycerides: 88 mg/dL (ref 0–149)
VLDL Cholesterol Cal: 15 mg/dL (ref 5–40)

## 2020-04-04 LAB — CBC WITH DIFFERENTIAL/PLATELET
Basophils Absolute: 0 10*3/uL (ref 0.0–0.2)
Basos: 1 %
EOS (ABSOLUTE): 0.2 10*3/uL (ref 0.0–0.4)
Eos: 4 %
Hematocrit: 34.3 % (ref 34.0–46.6)
Hemoglobin: 11.6 g/dL (ref 11.1–15.9)
Immature Grans (Abs): 0 10*3/uL (ref 0.0–0.1)
Immature Granulocytes: 0 %
Lymphocytes Absolute: 1.8 10*3/uL (ref 0.7–3.1)
Lymphs: 31 %
MCH: 29.5 pg (ref 26.6–33.0)
MCHC: 33.8 g/dL (ref 31.5–35.7)
MCV: 87 fL (ref 79–97)
Monocytes Absolute: 0.7 10*3/uL (ref 0.1–0.9)
Monocytes: 12 %
Neutrophils Absolute: 3.1 10*3/uL (ref 1.4–7.0)
Neutrophils: 52 %
Platelets: 202 10*3/uL (ref 150–450)
RBC: 3.93 x10E6/uL (ref 3.77–5.28)
RDW: 11.9 % (ref 11.7–15.4)
WBC: 5.8 10*3/uL (ref 3.4–10.8)

## 2020-04-04 LAB — COMPREHENSIVE METABOLIC PANEL
ALT: 14 IU/L (ref 0–32)
AST: 21 IU/L (ref 0–40)
Albumin/Globulin Ratio: 2 (ref 1.2–2.2)
Albumin: 4.2 g/dL (ref 3.6–4.6)
Alkaline Phosphatase: 75 IU/L (ref 48–121)
BUN/Creatinine Ratio: 16 (ref 12–28)
BUN: 19 mg/dL (ref 8–27)
Bilirubin Total: 0.8 mg/dL (ref 0.0–1.2)
CO2: 23 mmol/L (ref 20–29)
Calcium: 9.4 mg/dL (ref 8.7–10.3)
Chloride: 103 mmol/L (ref 96–106)
Creatinine, Ser: 1.18 mg/dL — ABNORMAL HIGH (ref 0.57–1.00)
GFR calc Af Amer: 48 mL/min/{1.73_m2} — ABNORMAL LOW (ref >59)
GFR calc non Af Amer: 41 mL/min/{1.73_m2} — ABNORMAL LOW (ref >59)
Globulin, Total: 2.1 g/dL (ref 1.5–4.5)
Glucose: 89 mg/dL (ref 65–99)
Potassium: 3.6 mmol/L (ref 3.5–5.2)
Sodium: 142 mmol/L (ref 134–144)
Total Protein: 6.3 g/dL (ref 6.0–8.5)

## 2020-04-04 LAB — CARDIOVASCULAR RISK ASSESSMENT

## 2020-04-04 LAB — LIPASE: Lipase: 27 U/L (ref 14–85)

## 2020-04-04 LAB — AMYLASE: Amylase: 45 U/L (ref 31–110)

## 2020-04-09 ENCOUNTER — Encounter: Payer: Self-pay | Admitting: Family Medicine

## 2020-05-02 DIAGNOSIS — M9901 Segmental and somatic dysfunction of cervical region: Secondary | ICD-10-CM | POA: Diagnosis not present

## 2020-05-02 DIAGNOSIS — M9902 Segmental and somatic dysfunction of thoracic region: Secondary | ICD-10-CM | POA: Diagnosis not present

## 2020-05-02 DIAGNOSIS — M542 Cervicalgia: Secondary | ICD-10-CM | POA: Diagnosis not present

## 2020-05-02 DIAGNOSIS — M4712 Other spondylosis with myelopathy, cervical region: Secondary | ICD-10-CM | POA: Diagnosis not present

## 2020-05-09 DIAGNOSIS — R1011 Right upper quadrant pain: Secondary | ICD-10-CM | POA: Diagnosis not present

## 2020-05-09 DIAGNOSIS — K219 Gastro-esophageal reflux disease without esophagitis: Secondary | ICD-10-CM | POA: Diagnosis not present

## 2020-05-09 DIAGNOSIS — R1310 Dysphagia, unspecified: Secondary | ICD-10-CM | POA: Diagnosis not present

## 2020-05-16 DIAGNOSIS — M9902 Segmental and somatic dysfunction of thoracic region: Secondary | ICD-10-CM | POA: Diagnosis not present

## 2020-05-16 DIAGNOSIS — M9901 Segmental and somatic dysfunction of cervical region: Secondary | ICD-10-CM | POA: Diagnosis not present

## 2020-05-16 DIAGNOSIS — M4712 Other spondylosis with myelopathy, cervical region: Secondary | ICD-10-CM | POA: Diagnosis not present

## 2020-05-16 DIAGNOSIS — M542 Cervicalgia: Secondary | ICD-10-CM | POA: Diagnosis not present

## 2020-05-20 ENCOUNTER — Other Ambulatory Visit: Payer: Self-pay | Admitting: Family Medicine

## 2020-05-20 DIAGNOSIS — I10 Essential (primary) hypertension: Secondary | ICD-10-CM

## 2020-05-23 ENCOUNTER — Other Ambulatory Visit: Payer: Self-pay

## 2020-05-23 ENCOUNTER — Ambulatory Visit (INDEPENDENT_AMBULATORY_CARE_PROVIDER_SITE_OTHER): Payer: PPO

## 2020-05-23 DIAGNOSIS — Z1152 Encounter for screening for COVID-19: Secondary | ICD-10-CM | POA: Diagnosis not present

## 2020-05-23 DIAGNOSIS — Z20822 Contact with and (suspected) exposure to covid-19: Secondary | ICD-10-CM | POA: Diagnosis not present

## 2020-05-23 DIAGNOSIS — Z01812 Encounter for preprocedural laboratory examination: Secondary | ICD-10-CM

## 2020-05-23 NOTE — Progress Notes (Signed)
Patient Name: Elizabeth Bradley Date of Birth: 09-16-1929 MRN:  [id number]  Elizabeth Bradley is a 86 y.o. yo female presenting for COVID-19 testing.  She is being tested from the vehicle.  Elizabeth Bradley is being tested due to an upcoming procedure.  Results will be faxed to Dr Misenheimer's office.  Patient understands that she will be notified of result as soon as we receive them, usually within 24-48 hours.  Printed and verbal information given to patient according to CDC Guidelines.  Erie Noe, LPN 8:36 PM

## 2020-05-25 LAB — SARS-COV-2, NAA 2 DAY TAT

## 2020-05-25 LAB — NOVEL CORONAVIRUS, NAA: SARS-CoV-2, NAA: NOT DETECTED

## 2020-05-30 DIAGNOSIS — Z85828 Personal history of other malignant neoplasm of skin: Secondary | ICD-10-CM | POA: Diagnosis not present

## 2020-05-30 DIAGNOSIS — K219 Gastro-esophageal reflux disease without esophagitis: Secondary | ICD-10-CM | POA: Diagnosis not present

## 2020-05-30 DIAGNOSIS — R1011 Right upper quadrant pain: Secondary | ICD-10-CM | POA: Diagnosis not present

## 2020-05-30 DIAGNOSIS — J439 Emphysema, unspecified: Secondary | ICD-10-CM | POA: Diagnosis not present

## 2020-05-30 DIAGNOSIS — Z8673 Personal history of transient ischemic attack (TIA), and cerebral infarction without residual deficits: Secondary | ICD-10-CM | POA: Diagnosis not present

## 2020-05-30 DIAGNOSIS — I10 Essential (primary) hypertension: Secondary | ICD-10-CM | POA: Diagnosis not present

## 2020-05-30 DIAGNOSIS — K222 Esophageal obstruction: Secondary | ICD-10-CM | POA: Diagnosis not present

## 2020-05-30 DIAGNOSIS — R1310 Dysphagia, unspecified: Secondary | ICD-10-CM | POA: Diagnosis not present

## 2020-05-30 DIAGNOSIS — K449 Diaphragmatic hernia without obstruction or gangrene: Secondary | ICD-10-CM | POA: Diagnosis not present

## 2020-06-20 IMAGING — CT CT ABD-PELV W/ CM
2 of 5 series · 16 of 46 positions shown, 18 images · IV contrast (omnipaque)
Comparison: Right upper quadrant ultrasound 09/08/2019. Hepatic
biliary scan yesterday. Abdominopelvic CT 08/19/2019

CLINICAL DATA: Right-sided abdominal pain. Pain worse postprandial.

EXAM:
CT ABDOMEN AND PELVIS WITH CONTRAST
TECHNIQUE: Multidetector CT imaging of the abdomen and pelvis was performed
using the standard protocol following bolus administration of
intravenous contrast.
CONTRAST:  80mL OMNIPAQUE IOHEXOL 300 MG/ML  SOLN

[Series 2: axial st · axial · 0.74mm/px · z∈[-414,-49]mm · 13 of 85 slices shown, 15 images]
[im 6/85  soft-tissue]
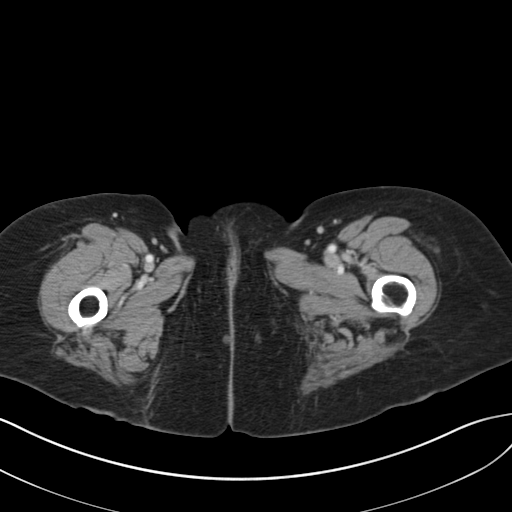
[im 6/85  bone]
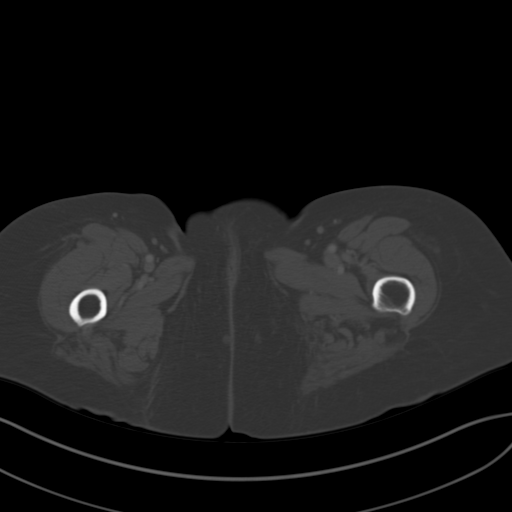
[im 11/85  soft-tissue]
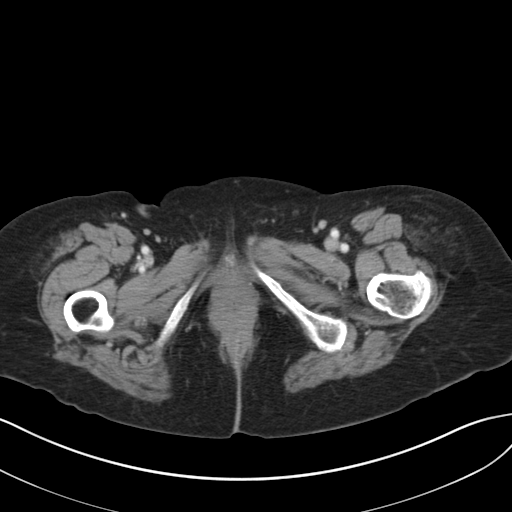
[im 16/85  soft-tissue]
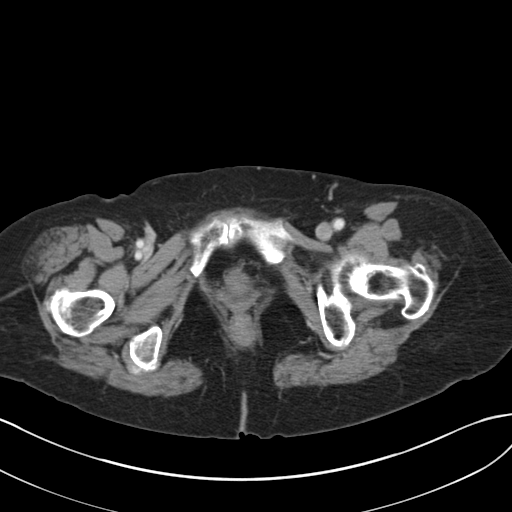
[im 27/85  soft-tissue]
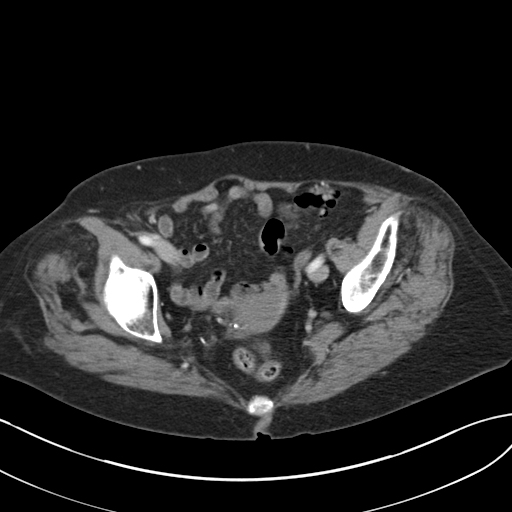
[im 32/85  soft-tissue]
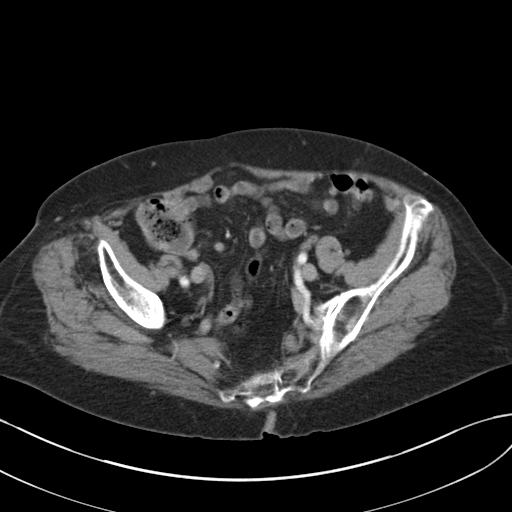
[im 37/85  soft-tissue]
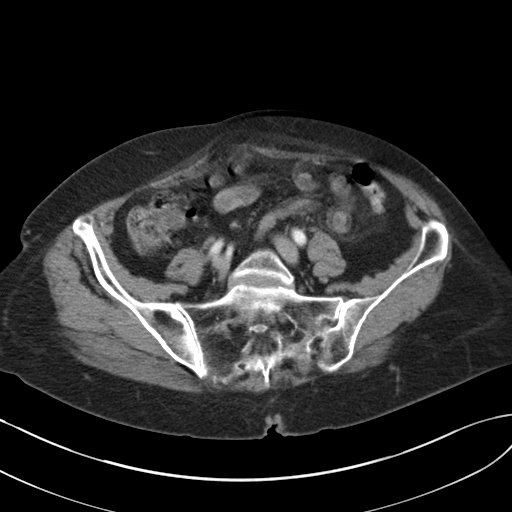
[im 43/85  soft-tissue]
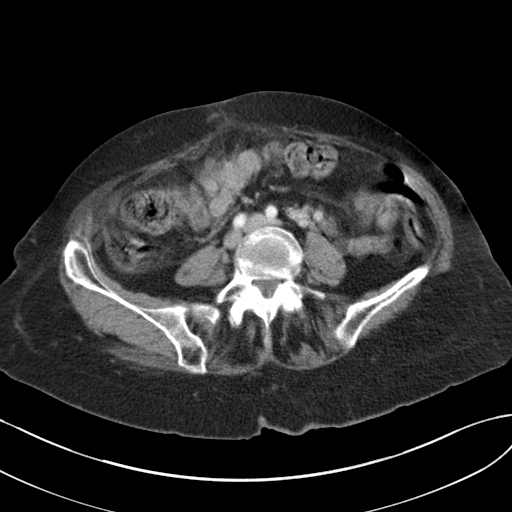
[im 48/85  soft-tissue]
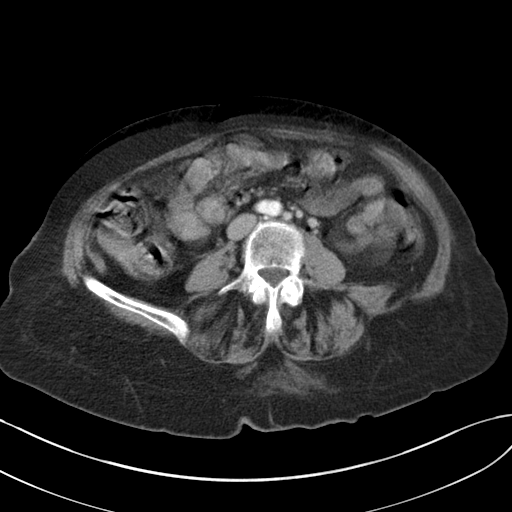
[im 53/85  soft-tissue]
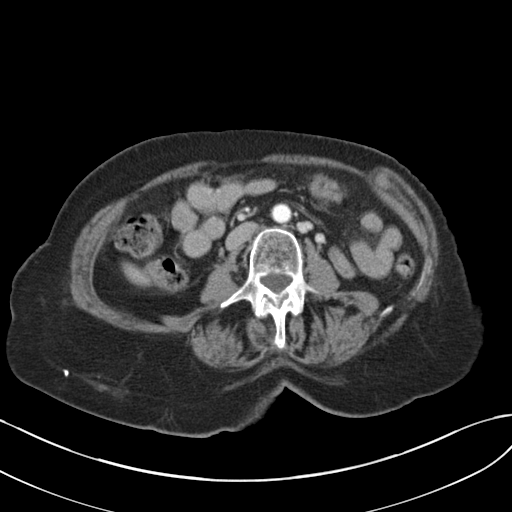
[im 53/85  bone]
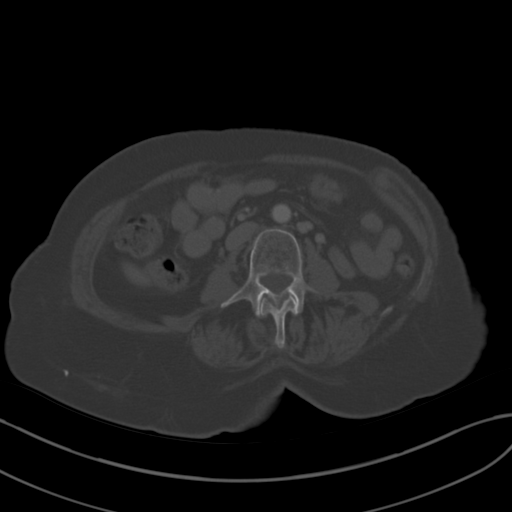
[im 58/85  soft-tissue]
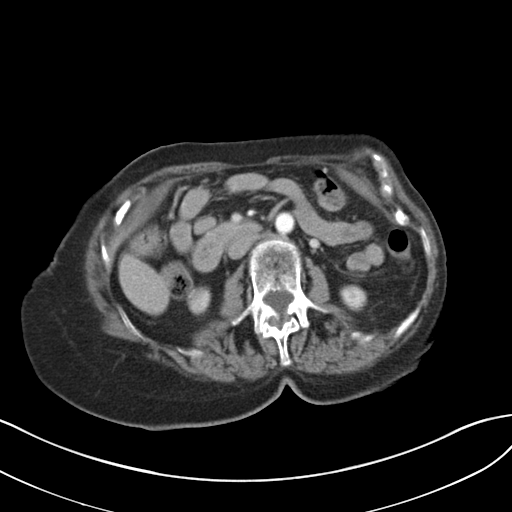
[im 69/85  soft-tissue]
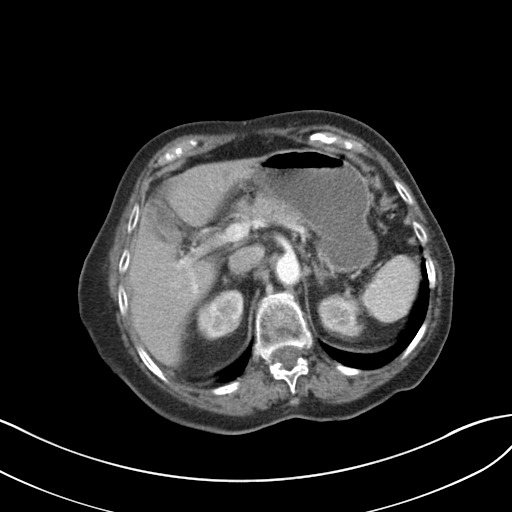
[im 74/85  soft-tissue]
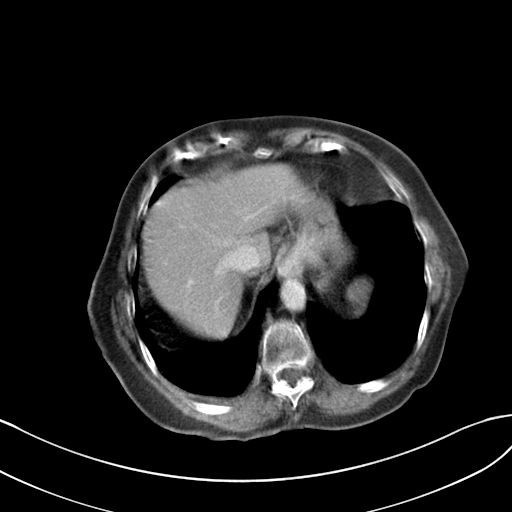
[im 79/85  soft-tissue]
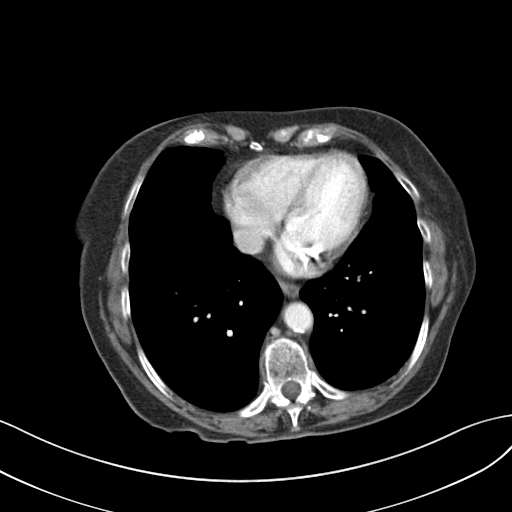

[Series 4: coronal st · coronal · 0.76mm/px · 3 of 136 slices shown]
[im 46/136  soft-tissue]
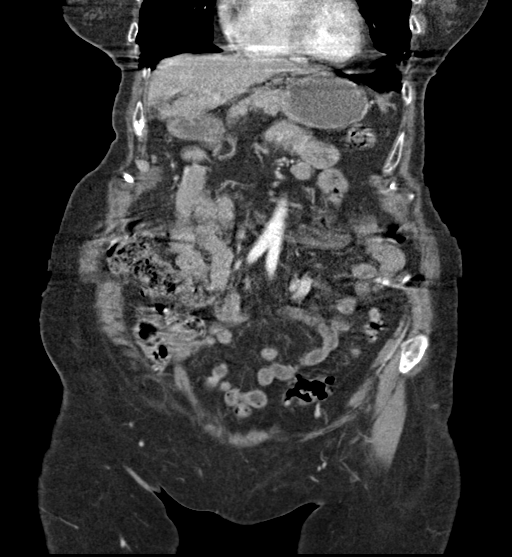
[im 61/136  soft-tissue]
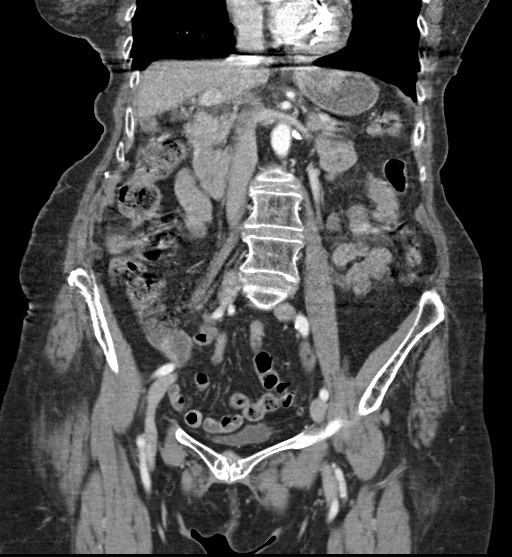
[im 76/136  soft-tissue]
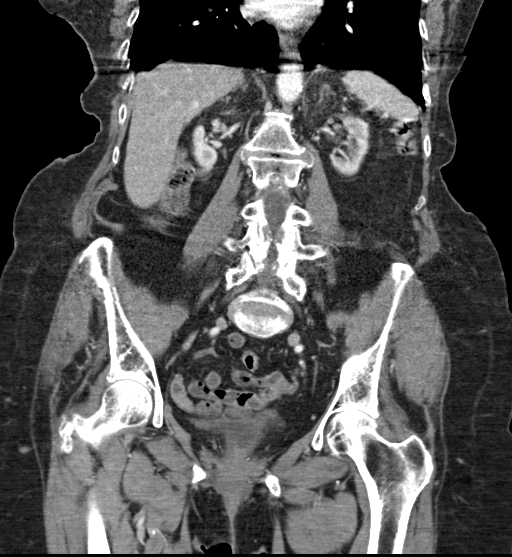

[16 of 46 positions shown; findings below may reference images not displayed]

FINDINGS: Lower chest: Motion limitations. No focal airspace disease or
significant pleural effusion.

Hepatobiliary: Motion artifact through the right upper quadrant.
Tiny low densities in the right left lobe of the liver, likely
cysts, but partially obscured by motion. Gallbladder partially
distended. No biliary dilatation.

Pancreas: Parenchymal atrophy. No ductal dilatation or inflammation.

Spleen: Normal in size without focal abnormality.

Adrenals/Urinary Tract: No adrenal nodule, partially obscured by
motion. Mild bilateral renal parenchymal thinning. No
hydronephrosis. No perinephric edema. Small cyst in the upper left
kidney. Ureter bladder completely nondistended and not well
evaluated.

Stomach/Bowel: Bowel evaluation limited by patient motion and lack
of enteric contrast. Possible small hiatal hernia. Fluid-filled
stomach without gastric wall thickening. No evidence of small-bowel
obstruction. No bowel inflammation or wall thickening, allowing for
motion limitations. Distal colonic diverticulosis without
diverticulitis. Appendix not well visualized, pericecal region
obscured by motion artifact on the current exam. No evidence of
appendicitis.

Vascular/Lymphatic: Mild aortic atherosclerosis. No aortic aneurysm.
Few prominent retroperitoneal collaterals in the left
retroperitoneum are unchanged from prior exam. No enlarged lymph
nodes in the abdomen or pelvis.

Reproductive: Hysterectomy. No adnexal mass.

Other: No free air free fluid. No inflammatory change in the abdomen
or pelvis.

Musculoskeletal: There are no acute or suspicious osseous
abnormalities. No abdominal wall abnormality to explain right-sided
pain.
IMPRESSION: 1. No acute abnormality or explanation for right-sided pain,
allowing for motion artifact limitations.
2. Colonic diverticulosis without diverticulitis.

Aortic Atherosclerosis ([id number].V).

## 2020-07-04 DIAGNOSIS — K222 Esophageal obstruction: Secondary | ICD-10-CM | POA: Diagnosis not present

## 2020-07-04 DIAGNOSIS — R1310 Dysphagia, unspecified: Secondary | ICD-10-CM | POA: Diagnosis not present

## 2020-07-10 ENCOUNTER — Ambulatory Visit (INDEPENDENT_AMBULATORY_CARE_PROVIDER_SITE_OTHER): Payer: PPO | Admitting: Family Medicine

## 2020-07-10 ENCOUNTER — Encounter: Payer: Self-pay | Admitting: Family Medicine

## 2020-07-10 ENCOUNTER — Other Ambulatory Visit: Payer: Self-pay

## 2020-07-10 VITALS — BP 132/64 | HR 84 | Temp 96.9°F | Resp 18 | Ht 59.0 in | Wt 128.0 lb

## 2020-07-10 DIAGNOSIS — E782 Mixed hyperlipidemia: Secondary | ICD-10-CM

## 2020-07-10 DIAGNOSIS — I10 Essential (primary) hypertension: Secondary | ICD-10-CM | POA: Diagnosis not present

## 2020-07-10 DIAGNOSIS — E038 Other specified hypothyroidism: Secondary | ICD-10-CM

## 2020-07-10 DIAGNOSIS — K219 Gastro-esophageal reflux disease without esophagitis: Secondary | ICD-10-CM | POA: Diagnosis not present

## 2020-07-10 DIAGNOSIS — Z23 Encounter for immunization: Secondary | ICD-10-CM

## 2020-07-10 DIAGNOSIS — M1909 Primary osteoarthritis, other specified site: Secondary | ICD-10-CM | POA: Diagnosis not present

## 2020-07-10 MED ORDER — CELECOXIB 100 MG PO CAPS: 100.0000 mg | ORAL_CAPSULE | Freq: Two times a day (BID) | ORAL | 1 refills | Status: DC

## 2020-07-10 MED ORDER — FAMOTIDINE 20 MG PO TABS: 20.0000 mg | ORAL_TABLET | Freq: Every day | ORAL | 3 refills | Status: DC

## 2020-07-10 MED ORDER — ROSUVASTATIN CALCIUM 5 MG PO TABS: 5.0000 mg | ORAL_TABLET | Freq: Every day | ORAL | 0 refills | Status: DC

## 2020-07-10 MED ORDER — AMLODIPINE BESYLATE 2.5 MG PO TABS: 2.5000 mg | ORAL_TABLET | Freq: Every day | ORAL | 0 refills | Status: DC

## 2020-07-10 NOTE — Progress Notes (Signed)
Subjective:  Patient ID: Elizabeth Bradley, female    DOB: 05-14-1930  Age: 86 y.o. MRN: [id number]  Chief Complaint  Patient presents with  . Hypertension  . Hypothyroidism    HPI  Patient presents with mixed hyperlipidemia.  Current treatment includes Zocor, but stopped 2 weeks ago. She does not know why she stopped it.  Compliance with treatment has been poor.  She has been on simvastatin intermittently for years.  She stops and starts it.;  She does maintain her low cholesterol diet, follows up as directed, and maintains her exercise regimen.  She walks.  She denies experiencing any hypercholesterolemia related symptoms.       Follow up of hypertension and stroke. Her current cardiac medication regimen includes amlodipine, plavix.  She is tolerating the medication well without side effects.  Compliance with treatment has been good; she takes her medication as directed and follows up as directed.      With regard to the other specified hypothyroidism, she is currently taking Synthroid, 25 mcg daily.  She denies any related symptoms.  She reports no symptoms suggestive of adverse medication effect.      Elizabeth Bradley presents with a diagnosis of other sequelae of cerebral infarction.  Associated symptoms include visual changes due to previous cva..     Current Outpatient Medications on File Prior to Visit  Medication Sig Dispense Refill  . aspirin EC 81 MG tablet Take 81 mg by mouth daily.    . cetirizine (ZYRTEC) 10 MG chewable tablet Chew 10 mg by mouth daily.    . Cholecalciferol (VITAMIN D-3) 25 MCG (1000 UT) CAPS Take by mouth.    . clopidogrel (PLAVIX) 75 MG tablet TAKE 1 TABLET BY MOUTH ONCE DAILY 90 tablet 0  . levothyroxine (SYNTHROID) 25 MCG tablet TAKE 1 TABLET BY MOUTH ONCE DAILY. 90 tablet 1  . pantoprazole (PROTONIX) 40 MG tablet Take 40 mg by mouth daily.     No current facility-administered medications on file prior to visit.   Past Medical History:  Diagnosis Date  . Atrophy  of thyroid   . Cerebrovascular accident (CVA) ([place])   . Essential hypertension   . GERD (gastroesophageal reflux disease)   . History of skin cancer   . Ischemic optic neuropathy of left eye   . Low back pain   . Mixed hyperlipidemia   . Osteoarthritis   . Stroke ([place])   . Vitamin D deficiency    Past Surgical History:  Procedure Laterality Date  . ABDOMINAL HYSTERECTOMY    . SALPINGOOPHORECTOMY    . THYROIDECTOMY      Family History  Problem Relation Age of Onset  . Hypertension Mother   . Heart failure Mother   . Heart failure Father   . Cancer Sister        LUNG  . Heart failure Brother   . Hypertension Brother   . Cancer Brother    Social History   Socioeconomic History  . Marital status: Widowed    Spouse name: Not on file  . Number of children: Not on file  . Years of education: Not on file  . Highest education level: Not on file  Occupational History  . Not on file  Tobacco Use  . Smoking status: Never Smoker  . Smokeless tobacco: Never Used  Substance and Sexual Activity  . Alcohol use: Never  . Drug use: Never  . Sexual activity: Not Currently  Other Topics Concern  . Not on file  Social History Narrative  . Not on file   Social Determinants of Health   Financial Resource Strain:   . Difficulty of Paying Living Expenses: Not on file  Food Insecurity:   . Worried About Charity fundraiser in the Last Year: Not on file  . Ran Out of Food in the Last Year: Not on file  Transportation Needs:   . Lack of Transportation (Medical): Not on file  . Lack of Transportation (Non-Medical): Not on file  Physical Activity:   . Days of Exercise per Week: Not on file  . Minutes of Exercise per Session: Not on file  Stress:   . Feeling of Stress : Not on file  Social Connections:   . Frequency of Communication with Friends and Family: Not on file  . Frequency of Social Gatherings with Friends and Family: Not on file  . Attends Religious Services: Not on  file  . Active Member of Clubs or Organizations: Not on file  . Attends Archivist Meetings: Not on file  . Marital Status: Not on file    Review of Systems  Constitutional: Negative for chills, fatigue and fever.  HENT: Negative for congestion, rhinorrhea and sore throat.   Respiratory: Positive for cough (Chronic.  Numerous work-ups all negative.  These included chest x-rays, ENT referrals, GI referrals, numerous medications without alleviation of symptoms.). Negative for shortness of breath.   Cardiovascular: Negative for chest pain and palpitations.  Gastrointestinal: Positive for abdominal pain (RUQ pain.  Patient has adjusted her diet and avoids red meat.  She had a very thorough work-up with gallbladder ultrasound, HIDA scan, and CT scan, as well as GI referral.  No etiology has been found.) and nausea. Negative for constipation, diarrhea and vomiting.  Genitourinary: Negative for dysuria and urgency.  Musculoskeletal: Negative for back pain and myalgias.  Neurological: Negative for dizziness, weakness, light-headedness and headaches.  Psychiatric/Behavioral: Negative for dysphoric mood. The patient is not nervous/anxious.      Objective:  BP 132/64   Pulse 84   Temp (!) 96.9 F (36.1 C)   Resp 18   Ht 4\' 11"  (1.499 m)   Wt 128 lb (58.1 kg)   BMI 25.85 kg/m   BP/Weight 07/10/2020 04/03/2020 12/21/6061  Systolic BP 016 010 932  Diastolic BP 64 62 76  Wt. (Lbs) 128 130 131  BMI 25.85 26.26 27.38    Physical Exam Vitals reviewed.  Constitutional:      Appearance: Normal appearance. She is normal weight.  Neck:     Vascular: No carotid bruit.  Cardiovascular:     Rate and Rhythm: Normal rate and regular rhythm.     Pulses: Normal pulses.     Heart sounds: Normal heart sounds.  Pulmonary:     Effort: Pulmonary effort is normal. No respiratory distress.     Breath sounds: Normal breath sounds.  Abdominal:     General: Abdomen is flat. Bowel sounds are  normal.     Palpations: Abdomen is soft.     Tenderness: There is no abdominal tenderness (Mild right upper quadrant). There is no guarding or rebound.  Neurological:     Mental Status: She is alert and oriented to person, place, and time.  Psychiatric:        Mood and Affect: Mood normal.        Behavior: Behavior normal.     Diabetic Foot Exam - Simple   No data filed       Lab  Results  Component Value Date   WBC 6.4 07/10/2020   HGB 12.4 07/10/2020   HCT 37.7 07/10/2020   PLT 245 07/10/2020   GLUCOSE 92 07/10/2020   CHOL 272 (H) 07/10/2020   TRIG 98 07/10/2020   HDL 87 07/10/2020   LDLCALC 169 (H) 07/10/2020   ALT 12 07/10/2020   AST 17 07/10/2020   NA 142 07/10/2020   K 4.1 07/10/2020   CL 106 07/10/2020   CREATININE 1.13 (H) 07/10/2020   BUN 24 07/10/2020   CO2 21 07/10/2020   TSH 2.760 07/10/2020      Assessment & Plan:   1. Essential hypertension Well controlled.  No changes to medicines.  Continue to work on eating a healthy diet and exercise.  Labs drawn today.  - CBC with Differential/Platelet - Comprehensive metabolic panel - amLODipine (NORVASC) 2.5 MG tablet; Take 1 tablet (2.5 mg total) by mouth daily.  Dispense: 90 tablet; Refill: 0  2. Mixed hyperlipidemia Poorly controlled. Recommend crestor  Continue to work on eating a healthy diet and exercise.  Labs drawn today.  - Lipid panel  3. Secondary hypothyroidism The current medical regimen is effective;  continue present plan and medications. - TSH  4. Gastroesophageal reflux disease without esophagitis The current medical regimen is effective;  continue present plan and medications. - famotidine (PEPCID) 20 MG tablet; Take 1 tablet (20 mg total) by mouth daily.  Dispense: 90 tablet; Refill: 3  5. Primary osteoarthritis of other site The current medical regimen is effective;  continue present plan and medications. - celecoxib (CELEBREX) 100 MG capsule; Take 1 capsule (100 mg total) by  mouth 2 (two) times daily.  Dispense: 180 capsule; Refill: 1  6. Need for immunization against influenza - Flu Vaccine QUAD High Dose(Fluad)    Meds ordered this encounter  Medications  . rosuvastatin (CRESTOR) 5 MG tablet    Sig: Take 1 tablet (5 mg total) by mouth daily.    Dispense:  90 tablet    Refill:  0  . famotidine (PEPCID) 20 MG tablet    Sig: Take 1 tablet (20 mg total) by mouth daily.    Dispense:  90 tablet    Refill:  3  . celecoxib (CELEBREX) 100 MG capsule    Sig: Take 1 capsule (100 mg total) by mouth 2 (two) times daily.    Dispense:  180 capsule    Refill:  1  . amLODipine (NORVASC) 2.5 MG tablet    Sig: Take 1 tablet (2.5 mg total) by mouth daily.    Dispense:  90 tablet    Refill:  0    Orders Placed This Encounter  Procedures  . Flu Vaccine QUAD High Dose(Fluad)  . CBC with Differential/Platelet  . Comprehensive metabolic panel  . Lipid panel  . TSH  . Cardiovascular Risk Assessment     Follow-up: She refuses annual wellness visit.  Follow-up at routine appointment in 3 months fasting.  An After Visit Summary was printed and given to the patient.  Rochel Brome [facility] [phone]

## 2020-07-11 LAB — COMPREHENSIVE METABOLIC PANEL
ALT: 12 IU/L (ref 0–32)
AST: 17 IU/L (ref 0–40)
Albumin/Globulin Ratio: 1.8 (ref 1.2–2.2)
Albumin: 4.2 g/dL (ref 3.6–4.6)
Alkaline Phosphatase: 90 IU/L (ref 44–121)
BUN/Creatinine Ratio: 21 (ref 12–28)
BUN: 24 mg/dL (ref 8–27)
Bilirubin Total: 0.5 mg/dL (ref 0.0–1.2)
CO2: 21 mmol/L (ref 20–29)
Calcium: 9.8 mg/dL (ref 8.7–10.3)
Chloride: 106 mmol/L (ref 96–106)
Creatinine, Ser: 1.13 mg/dL — ABNORMAL HIGH (ref 0.57–1.00)
GFR calc Af Amer: 50 mL/min/{1.73_m2} — ABNORMAL LOW (ref >59)
GFR calc non Af Amer: 43 mL/min/{1.73_m2} — ABNORMAL LOW (ref >59)
Globulin, Total: 2.3 g/dL (ref 1.5–4.5)
Glucose: 92 mg/dL (ref 65–99)
Potassium: 4.1 mmol/L (ref 3.5–5.2)
Sodium: 142 mmol/L (ref 134–144)
Total Protein: 6.5 g/dL (ref 6.0–8.5)

## 2020-07-11 LAB — CBC WITH DIFFERENTIAL/PLATELET
Basophils Absolute: 0 10*3/uL (ref 0.0–0.2)
Basos: 1 %
EOS (ABSOLUTE): 0.9 10*3/uL — ABNORMAL HIGH (ref 0.0–0.4)
Eos: 14 %
Hematocrit: 37.7 % (ref 34.0–46.6)
Hemoglobin: 12.4 g/dL (ref 11.1–15.9)
Immature Grans (Abs): 0 10*3/uL (ref 0.0–0.1)
Immature Granulocytes: 0 %
Lymphocytes Absolute: 1.5 10*3/uL (ref 0.7–3.1)
Lymphs: 24 %
MCH: 29.5 pg (ref 26.6–33.0)
MCHC: 32.9 g/dL (ref 31.5–35.7)
MCV: 90 fL (ref 79–97)
Monocytes Absolute: 0.9 10*3/uL (ref 0.1–0.9)
Monocytes: 14 %
Neutrophils Absolute: 3.1 10*3/uL (ref 1.4–7.0)
Neutrophils: 47 %
Platelets: 245 10*3/uL (ref 150–450)
RBC: 4.2 x10E6/uL (ref 3.77–5.28)
RDW: 12.2 % (ref 11.7–15.4)
WBC: 6.4 10*3/uL (ref 3.4–10.8)

## 2020-07-11 LAB — TSH: TSH: 2.76 u[IU]/mL (ref 0.450–4.500)

## 2020-07-11 LAB — LIPID PANEL
Chol/HDL Ratio: 3.1 ratio (ref 0.0–4.4)
Cholesterol, Total: 272 mg/dL — ABNORMAL HIGH (ref 100–199)
HDL: 87 mg/dL (ref >39)
LDL Chol Calc (NIH): 169 mg/dL — ABNORMAL HIGH (ref 0–99)
Triglycerides: 98 mg/dL (ref 0–149)
VLDL Cholesterol Cal: 16 mg/dL (ref 5–40)

## 2020-07-11 LAB — CARDIOVASCULAR RISK ASSESSMENT

## 2020-07-14 ENCOUNTER — Encounter: Payer: Self-pay | Admitting: Family Medicine

## 2020-09-12 ENCOUNTER — Other Ambulatory Visit: Payer: Self-pay | Admitting: Physician Assistant

## 2020-10-18 DIAGNOSIS — L0100 Impetigo, unspecified: Secondary | ICD-10-CM | POA: Diagnosis not present

## 2020-10-18 DIAGNOSIS — L820 Inflamed seborrheic keratosis: Secondary | ICD-10-CM | POA: Diagnosis not present

## 2020-10-18 DIAGNOSIS — L814 Other melanin hyperpigmentation: Secondary | ICD-10-CM | POA: Diagnosis not present

## 2020-10-18 DIAGNOSIS — D485 Neoplasm of uncertain behavior of skin: Secondary | ICD-10-CM | POA: Diagnosis not present

## 2020-10-18 DIAGNOSIS — L821 Other seborrheic keratosis: Secondary | ICD-10-CM | POA: Diagnosis not present

## 2020-10-22 NOTE — Progress Notes (Signed)
Subjective:  Patient ID: Elizabeth Bradley, female    DOB: [birth year]  Age: 87 y.o. MRN: [id number]  Chief Complaint  Patient presents with   Hyperlipidemia   Hypertension    HPI Essential hypertension - Norvasc 2.5 mg once dialy.   Mixed hyperlipidemia - Crestor 5 mg 3 times per week. Does not cause muscle pain.   Secondary hypothyroidism - Synthroid 25 mcg once daily.  Gastroesophageal reflux disease without esophagitis-Pepcid 20 mg one daily prn. Not taking Protonix 40 mg daily.   Aortic Atherosclerosis-crestor and on plavix 75 mg daily. Is not taking aspirin daily.   Hair loss: Saw dermatology. Just started on finasteride 5 mg 1/2 pill daily   Current Outpatient Medications on File Prior to Visit  Medication Sig Dispense Refill   cetirizine (ZYRTEC) 10 MG chewable tablet Chew 10 mg by mouth daily.     Cholecalciferol (VITAMIN D-3) 25 MCG (1000 UT) CAPS Take by mouth.     famotidine (PEPCID) 20 MG tablet Take 1 tablet (20 mg total) by mouth daily. 90 tablet 3   finasteride (PROSCAR) 5 MG tablet Take by mouth.     No current facility-administered medications on file prior to visit.   Past Medical History:  Diagnosis Date   Atrophy of thyroid    Cerebrovascular accident (CVA) ([place])    Essential hypertension    GERD (gastroesophageal reflux disease)    History of skin cancer    Ischemic optic neuropathy of left eye    Low back pain    Mixed hyperlipidemia    Osteoarthritis    Stroke ([place])    Vitamin D deficiency    Past Surgical History:  Procedure Laterality Date   ABDOMINAL HYSTERECTOMY     SALPINGOOPHORECTOMY     THYROIDECTOMY      Family History  Problem Relation Age of Onset   Hypertension Mother    Heart failure Mother    Heart failure Father    Cancer Sister        LUNG   Heart failure Brother    Hypertension Brother    Cancer Brother    Social History   Socioeconomic History   Marital status: Widowed    Spouse name:  Not on file   Number of children: Not on file   Years of education: Not on file   Highest education level: Not on file  Occupational History   Not on file  Tobacco Use   Smoking status: Never Smoker   Smokeless tobacco: Never Used  Substance and Sexual Activity   Alcohol use: Never   Drug use: Never   Sexual activity: Not Currently  Other Topics Concern   Not on file  Social History Narrative   Not on file   Social Determinants of Health   Financial Resource Strain: Not on file  Food Insecurity: Not on file  Transportation Needs: Not on file  Physical Activity: Not on file  Stress: Not on file  Social Connections: Not on file    Review of Systems  Constitutional: Negative for chills, fatigue and fever.  HENT: Negative for congestion, ear pain, rhinorrhea and sore throat.   Respiratory: Negative for cough and shortness of breath.   Cardiovascular: Negative for chest pain.  Gastrointestinal: Negative for abdominal pain, constipation, diarrhea, nausea and vomiting.  Genitourinary: Negative for dysuria and urgency.  Musculoskeletal: Negative for back pain and myalgias.  Neurological: Negative for dizziness, weakness, light-headedness and headaches.  Psychiatric/Behavioral: Negative for dysphoric mood. The patient is  not nervous/anxious.      Objective:  BP (!) 142/64    Pulse 83    Temp (!) 97.4 F (36.3 C)    Resp 18    Ht 4\' 11"  (1.499 m)    Wt 127 lb (57.6 kg)    SpO2 97%    BMI 25.65 kg/m   BP/Weight 10/23/2020 07/10/2020 2/58/5277  Systolic BP 824 235 361  Diastolic BP 64 64 62  Wt. (Lbs) 127 128 130  BMI 25.65 25.85 26.26    Physical Exam Vitals reviewed.  Constitutional:      Appearance: Normal appearance. She is normal weight.  Neck:     Vascular: No carotid bruit.  Cardiovascular:     Rate and Rhythm: Normal rate and regular rhythm.     Pulses: Normal pulses.     Heart sounds: Normal heart sounds.  Pulmonary:     Effort: Pulmonary effort  is normal. No respiratory distress.     Breath sounds: Normal breath sounds.  Abdominal:     General: Abdomen is flat. Bowel sounds are normal.     Palpations: Abdomen is soft.     Tenderness: There is no abdominal tenderness.  Neurological:     Mental Status: She is alert and oriented to person, place, and time.  Psychiatric:        Mood and Affect: Mood normal.        Behavior: Behavior normal.       Lab Results  Component Value Date   WBC 6.4 07/10/2020   HGB 12.4 07/10/2020   HCT 37.7 07/10/2020   PLT 245 07/10/2020   GLUCOSE 92 07/10/2020   CHOL 272 (H) 07/10/2020   TRIG 98 07/10/2020   HDL 87 07/10/2020   LDLCALC 169 (H) 07/10/2020   ALT 12 07/10/2020   AST 17 07/10/2020   NA 142 07/10/2020   K 4.1 07/10/2020   CL 106 07/10/2020   CREATININE 1.13 (H) 07/10/2020   BUN 24 07/10/2020   CO2 21 07/10/2020   TSH 2.760 07/10/2020      Assessment & Plan:   1. Essential hypertension The current medical regimen is effective;  continue present plan and medications. - Comprehensive metabolic panel  2. Mixed hyperlipidemia Well controlled.  No changes to medicines.  Continue to work on eating a healthy diet and exercise.  Labs drawn today.  - Lipid panel - CBC with Differential/Platelet  3. Secondary hypothyroidism The current medical regimen is effective;  continue present plan and medications.  4. Gastroesophageal reflux disease without esophagitis The current medical regimen is effective;  continue present plan and medications.  5. Atherosclerosis of aorta ([place])  Continue on crestor and plavix.    Orders Placed This Encounter  Procedures   Lipid panel   Comprehensive metabolic panel   CBC with Differential/Platelet     Follow-up: Return in about 3 months (around 01/20/2021) for fasting.  An After Visit Summary was printed and given to the patient.  Rochel Brome, MD [facility] [phone]

## 2020-10-23 ENCOUNTER — Encounter: Payer: Self-pay | Admitting: Family Medicine

## 2020-10-23 ENCOUNTER — Ambulatory Visit (INDEPENDENT_AMBULATORY_CARE_PROVIDER_SITE_OTHER): Payer: PPO | Admitting: Family Medicine

## 2020-10-23 ENCOUNTER — Other Ambulatory Visit: Payer: Self-pay

## 2020-10-23 VITALS — BP 142/64 | HR 83 | Temp 97.4°F | Resp 18 | Ht 59.0 in | Wt 127.0 lb

## 2020-10-23 DIAGNOSIS — M1909 Primary osteoarthritis, other specified site: Secondary | ICD-10-CM

## 2020-10-23 DIAGNOSIS — K219 Gastro-esophageal reflux disease without esophagitis: Secondary | ICD-10-CM | POA: Diagnosis not present

## 2020-10-23 DIAGNOSIS — I10 Essential (primary) hypertension: Secondary | ICD-10-CM

## 2020-10-23 DIAGNOSIS — E038 Other specified hypothyroidism: Secondary | ICD-10-CM | POA: Diagnosis not present

## 2020-10-23 DIAGNOSIS — E782 Mixed hyperlipidemia: Secondary | ICD-10-CM | POA: Diagnosis not present

## 2020-10-23 DIAGNOSIS — I700 Atherosclerosis of aorta: Secondary | ICD-10-CM

## 2020-10-23 MED ORDER — AMLODIPINE BESYLATE 2.5 MG PO TABS: 2.5000 mg | ORAL_TABLET | Freq: Every day | ORAL | 0 refills | Status: DC

## 2020-10-23 MED ORDER — CLOPIDOGREL BISULFATE 75 MG PO TABS: 75.0000 mg | ORAL_TABLET | Freq: Every day | ORAL | 3 refills | Status: DC

## 2020-10-23 MED ORDER — ROSUVASTATIN CALCIUM 5 MG PO TABS: 5.0000 mg | ORAL_TABLET | ORAL | 0 refills | Status: DC

## 2020-10-23 MED ORDER — CELECOXIB 100 MG PO CAPS: 100.0000 mg | ORAL_CAPSULE | Freq: Every day | ORAL | 0 refills | Status: DC | PRN

## 2020-10-23 MED ORDER — LEVOTHYROXINE SODIUM 25 MCG PO TABS: 25.0000 ug | ORAL_TABLET | Freq: Every day | ORAL | 1 refills | Status: DC

## 2020-10-24 LAB — CBC WITH DIFFERENTIAL/PLATELET
Basophils Absolute: 0.1 10*3/uL (ref 0.0–0.2)
Basos: 1 %
EOS (ABSOLUTE): 0.3 10*3/uL (ref 0.0–0.4)
Eos: 6 %
Hematocrit: 39.7 % (ref 34.0–46.6)
Hemoglobin: 12.6 g/dL (ref 11.1–15.9)
Immature Grans (Abs): 0 10*3/uL (ref 0.0–0.1)
Immature Granulocytes: 0 %
Lymphocytes Absolute: 1.9 10*3/uL (ref 0.7–3.1)
Lymphs: 40 %
MCH: 29.2 pg (ref 26.6–33.0)
MCHC: 31.7 g/dL (ref 31.5–35.7)
MCV: 92 fL (ref 79–97)
Monocytes Absolute: 0.7 10*3/uL (ref 0.1–0.9)
Monocytes: 14 %
Neutrophils Absolute: 1.9 10*3/uL (ref 1.4–7.0)
Neutrophils: 39 %
Platelets: 217 10*3/uL (ref 150–450)
RBC: 4.31 x10E6/uL (ref 3.77–5.28)
RDW: 12.3 % (ref 11.7–15.4)
WBC: 4.8 10*3/uL (ref 3.4–10.8)

## 2020-10-24 LAB — COMPREHENSIVE METABOLIC PANEL
ALT: 12 IU/L (ref 0–32)
AST: 16 IU/L (ref 0–40)
Albumin/Globulin Ratio: 1.8 (ref 1.2–2.2)
Albumin: 4.3 g/dL (ref 3.6–4.6)
Alkaline Phosphatase: 87 IU/L (ref 44–121)
BUN/Creatinine Ratio: 13 (ref 12–28)
BUN: 16 mg/dL (ref 8–27)
Bilirubin Total: 0.4 mg/dL (ref 0.0–1.2)
CO2: 22 mmol/L (ref 20–29)
Calcium: 9.8 mg/dL (ref 8.7–10.3)
Chloride: 105 mmol/L (ref 96–106)
Creatinine, Ser: 1.27 mg/dL — ABNORMAL HIGH (ref 0.57–1.00)
GFR calc Af Amer: 44 mL/min/{1.73_m2} — ABNORMAL LOW (ref >59)
GFR calc non Af Amer: 38 mL/min/{1.73_m2} — ABNORMAL LOW (ref >59)
Globulin, Total: 2.4 g/dL (ref 1.5–4.5)
Glucose: 99 mg/dL (ref 65–99)
Potassium: 4.1 mmol/L (ref 3.5–5.2)
Sodium: 142 mmol/L (ref 134–144)
Total Protein: 6.7 g/dL (ref 6.0–8.5)

## 2020-10-24 LAB — LIPID PANEL
Chol/HDL Ratio: 2.7 ratio (ref 0.0–4.4)
Cholesterol, Total: 254 mg/dL — ABNORMAL HIGH (ref 100–199)
HDL: 95 mg/dL (ref >39)
LDL Chol Calc (NIH): 142 mg/dL — ABNORMAL HIGH (ref 0–99)
Triglycerides: 103 mg/dL (ref 0–149)
VLDL Cholesterol Cal: 17 mg/dL (ref 5–40)

## 2020-10-24 LAB — CARDIOVASCULAR RISK ASSESSMENT

## 2020-10-25 ENCOUNTER — Other Ambulatory Visit: Payer: Self-pay

## 2021-01-21 DIAGNOSIS — I10 Essential (primary) hypertension: Secondary | ICD-10-CM | POA: Diagnosis not present

## 2021-01-21 DIAGNOSIS — I471 Supraventricular tachycardia: Secondary | ICD-10-CM | POA: Diagnosis not present

## 2021-01-21 DIAGNOSIS — E782 Mixed hyperlipidemia: Secondary | ICD-10-CM | POA: Diagnosis not present

## 2021-01-29 ENCOUNTER — Ambulatory Visit (INDEPENDENT_AMBULATORY_CARE_PROVIDER_SITE_OTHER): Payer: PPO | Admitting: Family Medicine

## 2021-01-29 ENCOUNTER — Other Ambulatory Visit: Payer: Self-pay

## 2021-01-29 ENCOUNTER — Encounter: Payer: Self-pay | Admitting: Family Medicine

## 2021-01-29 VITALS — BP 134/64 | HR 56 | Temp 97.2°F | Resp 16 | Ht 59.0 in | Wt 130.0 lb

## 2021-01-29 DIAGNOSIS — I471 Supraventricular tachycardia: Secondary | ICD-10-CM

## 2021-01-29 DIAGNOSIS — I10 Essential (primary) hypertension: Secondary | ICD-10-CM

## 2021-01-29 DIAGNOSIS — E038 Other specified hypothyroidism: Secondary | ICD-10-CM

## 2021-01-29 DIAGNOSIS — Z23 Encounter for immunization: Secondary | ICD-10-CM

## 2021-01-29 DIAGNOSIS — H47012 Ischemic optic neuropathy, left eye: Secondary | ICD-10-CM | POA: Diagnosis not present

## 2021-01-29 DIAGNOSIS — E782 Mixed hyperlipidemia: Secondary | ICD-10-CM | POA: Diagnosis not present

## 2021-01-29 DIAGNOSIS — I700 Atherosclerosis of aorta: Secondary | ICD-10-CM

## 2021-01-29 NOTE — Progress Notes (Signed)
Subjective:  Patient ID: Elizabeth Bradley, female    DOB: 09-14-1929  Age: 87 y.o. MRN: [id number]  Chief Complaint  Patient presents with  . Hyperlipidemia  . Hypothyroidism  . COPD    HPI 1. Essential hypertension/PSVT Is currently on amlodipine and diltiazem.  Recently saw cardiology who increased her diltiazem from 90 to 120 mg more for her PSVT versus uncontrolled hypertension.  She is having fewer palpitations.  She is tolerating the increase in the medication well. 2. Mixed hyperlipidemia/aortic atherosclerosis Patient has been taking the Crestor 5 mg once daily.  She is tolerating this also.  No muscle pain.  3. Secondary hypothyroidism Currently on Synthroid 25 mcg once daily.  TSH was last checked in November and was within normal limits.  4.  GERD: Patient was on Zantac for years and then it was removed from the market.  She currently takes Pepcid as needed.  5.  History of a stroke.  Patient takes Plavix 75 mg once daily.  Patient has vision loss secondary to the stroke she had in 2020.  She had ischemic optic neuropathy of left eye  Current Outpatient Medications on File Prior to Visit  Medication Sig Dispense Refill  . diltiazem (TIAZAC) 120 MG 24 hr capsule Take 1 capsule by mouth daily.    Marland Kitchen amLODipine (NORVASC) 2.5 MG tablet Take 1 tablet (2.5 mg total) by mouth daily. 90 tablet 0  . cetirizine (ZYRTEC) 10 MG chewable tablet Chew 10 mg by mouth daily.    . Cholecalciferol (VITAMIN D-3) 25 MCG (1000 UT) CAPS Take by mouth.    . clopidogrel (PLAVIX) 75 MG tablet Take 1 tablet (75 mg total) by mouth daily. 90 tablet 3  . famotidine (PEPCID) 20 MG tablet Take 1 tablet (20 mg total) by mouth daily. 90 tablet 3  . levothyroxine (SYNTHROID) 25 MCG tablet Take 1 tablet (25 mcg total) by mouth daily. 90 tablet 1  . rosuvastatin (CRESTOR) 5 MG tablet Take 1 tablet (5 mg total) by mouth 3 (three) times a week. 1 tablet 0   No current facility-administered medications on file  prior to visit.   Past Medical History:  Diagnosis Date  . Atrophy of thyroid   . Cerebrovascular accident (CVA) ([place])   . Essential hypertension   . GERD (gastroesophageal reflux disease)   . History of skin cancer   . Ischemic optic neuropathy of left eye   . Low back pain   . Mixed hyperlipidemia   . Osteoarthritis   . Stroke ([place])   . Vitamin D deficiency    Past Surgical History:  Procedure Laterality Date  . ABDOMINAL HYSTERECTOMY    . SALPINGOOPHORECTOMY    . THYROIDECTOMY      Family History  Problem Relation Age of Onset  . Hypertension Mother   . Heart failure Mother   . Heart failure Father   . Cancer Sister        LUNG  . Heart failure Brother   . Hypertension Brother   . Cancer Brother    Social History   Socioeconomic History  . Marital status: Widowed    Spouse name: Not on file  . Number of children: Not on file  . Years of education: Not on file  . Highest education level: Not on file  Occupational History  . Not on file  Tobacco Use  . Smoking status: Never Smoker  . Smokeless tobacco: Never Used  Substance and Sexual Activity  . Alcohol use:  Never  . Drug use: Never  . Sexual activity: Not Currently  Other Topics Concern  . Not on file  Social History Narrative  . Not on file   Social Determinants of Health   Financial Resource Strain: Not on file  Food Insecurity: Not on file  Transportation Needs: Not on file  Physical Activity: Not on file  Stress: Not on file  Social Connections: Not on file    Review of Systems  Constitutional: Positive for fatigue. Negative for chills and fever.  HENT: Negative for congestion, rhinorrhea and sore throat.   Respiratory: Positive for cough and shortness of breath.   Cardiovascular: Negative for chest pain.  Gastrointestinal: Negative for abdominal pain, constipation, diarrhea, nausea and vomiting.  Genitourinary: Negative for dysuria and urgency.  Musculoskeletal: Negative for back pain  and myalgias.  Neurological: Negative for dizziness, weakness, light-headedness and headaches.  Psychiatric/Behavioral: Negative for dysphoric mood. The patient is not nervous/anxious.      Objective:  BP 134/64   Pulse (!) 56   Temp (!) 97.2 F (36.2 C)   Resp 16   Ht 4\' 11"  (1.499 m)   Wt 130 lb (59 kg)   BMI 26.26 kg/m   BP/Weight 01/29/2021 10/23/2020 25/04/5276  Systolic BP 824 235 361  Diastolic BP 64 64 64  Wt. (Lbs) 130 127 128  BMI 26.26 25.65 25.85    Physical Exam Vitals reviewed.  Constitutional:      Appearance: Normal appearance. She is normal weight.  Neck:     Vascular: No carotid bruit.  Cardiovascular:     Rate and Rhythm: Normal rate and regular rhythm.     Heart sounds: Normal heart sounds.  Pulmonary:     Effort: Pulmonary effort is normal. No respiratory distress.     Breath sounds: Normal breath sounds.  Abdominal:     General: Abdomen is flat. Bowel sounds are normal.     Palpations: Abdomen is soft.     Tenderness: There is no abdominal tenderness.  Musculoskeletal:     Right lower leg: No edema.     Left lower leg: No edema.  Neurological:     Mental Status: She is alert and oriented to person, place, and time.  Psychiatric:        Mood and Affect: Mood normal.        Behavior: Behavior normal.     Diabetic Foot Exam - Simple   No data filed      Lab Results  Component Value Date   WBC 4.8 10/23/2020   HGB 12.6 10/23/2020   HCT 39.7 10/23/2020   PLT 217 10/23/2020   GLUCOSE 99 10/23/2020   CHOL 254 (H) 10/23/2020   TRIG 103 10/23/2020   HDL 95 10/23/2020   LDLCALC 142 (H) 10/23/2020   ALT 12 10/23/2020   AST 16 10/23/2020   NA 142 10/23/2020   K 4.1 10/23/2020   CL 105 10/23/2020   CREATININE 1.27 (H) 10/23/2020   BUN 16 10/23/2020   CO2 22 10/23/2020   TSH 2.760 07/10/2020      Assessment & Plan:   1. Essential hypertension Well controlled.  No changes to medicines.  Continue to work on eating a healthy diet  and exercise.  Labs drawn today.  - CBC with Differential/Platelet - Comprehensive metabolic panel  2. Mixed hyperlipidemia Await labs for recommendations.  Continue to work on eating a healthy diet and exercise.  Labs drawn today.  - Lipid panel  3. Secondary  hypothyroidism - TSH  4. Atherosclerosis of aorta ([facility]) Continue plavix and crestor.   5. PSVT (paroxysmal supraventricular tachycardia) ([facility]) The current medical regimen is effective;  continue present plan and medications. Follow up with Dr. Phillis Haggis as recommended by him.  - TSH  6. Need for 23-polyvalent pneumococcal polysaccharide vaccine - Pneumococcal polysaccharide vaccine 23-valent greater than or equal to 2yo subcutaneous/IM  7. 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 Continue crestor and plavix.   Orders Placed This Encounter  Procedures  . Pneumococcal polysaccharide vaccine 23-valent greater than or equal to 2yo subcutaneous/IM  . CBC with Differential/Platelet  . Comprehensive metabolic panel  . Lipid panel  . TSH     Follow-up: Return in about 6 months (around 08/01/2021) for fasting. Marland Kitchen  An After Visit Summary was printed and given to the patient.  Rochel Brome, MD [facility] [phone]

## 2021-01-30 LAB — CBC WITH DIFFERENTIAL/PLATELET
Basophils Absolute: 0 10*3/uL (ref 0.0–0.2)
Basos: 1 %
EOS (ABSOLUTE): 0.7 10*3/uL — ABNORMAL HIGH (ref 0.0–0.4)
Eos: 13 %
Hematocrit: 35.9 % (ref 34.0–46.6)
Hemoglobin: 11.9 g/dL (ref 11.1–15.9)
Immature Grans (Abs): 0 10*3/uL (ref 0.0–0.1)
Immature Granulocytes: 0 %
Lymphocytes Absolute: 1.9 10*3/uL (ref 0.7–3.1)
Lymphs: 37 %
MCH: 29.8 pg (ref 26.6–33.0)
MCHC: 33.1 g/dL (ref 31.5–35.7)
MCV: 90 fL (ref 79–97)
Monocytes Absolute: 0.6 10*3/uL (ref 0.1–0.9)
Monocytes: 11 %
Neutrophils Absolute: 2 10*3/uL (ref 1.4–7.0)
Neutrophils: 38 %
Platelets: 202 10*3/uL (ref 150–450)
RBC: 4 x10E6/uL (ref 3.77–5.28)
RDW: 12.4 % (ref 11.7–15.4)
WBC: 5.2 10*3/uL (ref 3.4–10.8)

## 2021-01-30 LAB — COMPREHENSIVE METABOLIC PANEL
ALT: 11 IU/L (ref 0–32)
AST: 15 IU/L (ref 0–40)
Albumin/Globulin Ratio: 1.8 (ref 1.2–2.2)
Albumin: 4.2 g/dL (ref 3.6–4.6)
Alkaline Phosphatase: 85 IU/L (ref 44–121)
BUN/Creatinine Ratio: 19 (ref 12–28)
BUN: 23 mg/dL (ref 8–27)
Bilirubin Total: 0.6 mg/dL (ref 0.0–1.2)
CO2: 20 mmol/L (ref 20–29)
Calcium: 9.7 mg/dL (ref 8.7–10.3)
Chloride: 107 mmol/L — ABNORMAL HIGH (ref 96–106)
Creatinine, Ser: 1.24 mg/dL — ABNORMAL HIGH (ref 0.57–1.00)
Globulin, Total: 2.4 g/dL (ref 1.5–4.5)
Glucose: 96 mg/dL (ref 65–99)
Potassium: 3.9 mmol/L (ref 3.5–5.2)
Sodium: 143 mmol/L (ref 134–144)
Total Protein: 6.6 g/dL (ref 6.0–8.5)
eGFR: 42 mL/min/{1.73_m2} — ABNORMAL LOW (ref >59)

## 2021-01-30 LAB — LIPID PANEL
Chol/HDL Ratio: 2.1 ratio (ref 0.0–4.4)
Cholesterol, Total: 193 mg/dL (ref 100–199)
HDL: 93 mg/dL (ref >39)
LDL Chol Calc (NIH): 81 mg/dL (ref 0–99)
Triglycerides: 110 mg/dL (ref 0–149)
VLDL Cholesterol Cal: 19 mg/dL (ref 5–40)

## 2021-01-30 LAB — CARDIOVASCULAR RISK ASSESSMENT

## 2021-01-30 LAB — TSH: TSH: 2.98 u[IU]/mL (ref 0.450–4.500)

## 2021-02-04 ENCOUNTER — Telehealth: Payer: Self-pay | Admitting: Family Medicine

## 2021-02-04 NOTE — Chronic Care Management (AMB) (Signed)
  Chronic Care Management   Note  02/04/2021 Name: Elizabeth Bradley MRN: [id number] DOB: December 31, 1929  Elizabeth Bradley is a 87 y.o. year old female who is a primary care patient of Cox, Kirsten, MD. I reached out to Renette Butters by phone today in response to a referral sent by Elizabeth Bradley's PCP, Cox, Kirsten, MD.   Elizabeth Bradley was given information about Chronic Care Management services today including:  1. CCM service includes personalized support from designated clinical staff supervised by her physician, including individualized plan of care and coordination with other care providers 2. 24/7 contact phone numbers for assistance for urgent and routine care needs. 3. Service will only be billed when office clinical staff spend 20 minutes or more in a month to coordinate care. 4. Only one practitioner may furnish and bill the service in a calendar month. 5. The patient may stop CCM services at any time (effective at the end of the month) by phone call to the office staff.   Patient did not agree to enrollment in care management services and does not wish to consider at this time.  Follow up plan:   Tatjana Secretary/administrator

## 2021-03-13 ENCOUNTER — Other Ambulatory Visit: Payer: Self-pay | Admitting: Family Medicine

## 2021-03-13 DIAGNOSIS — I10 Essential (primary) hypertension: Secondary | ICD-10-CM

## 2021-03-28 DIAGNOSIS — H4720 Unspecified optic atrophy: Secondary | ICD-10-CM | POA: Diagnosis not present

## 2021-04-22 DIAGNOSIS — L570 Actinic keratosis: Secondary | ICD-10-CM | POA: Diagnosis not present

## 2021-04-22 DIAGNOSIS — L820 Inflamed seborrheic keratosis: Secondary | ICD-10-CM | POA: Diagnosis not present

## 2021-04-22 DIAGNOSIS — L821 Other seborrheic keratosis: Secondary | ICD-10-CM | POA: Diagnosis not present

## 2021-05-02 DIAGNOSIS — D0439 Carcinoma in situ of skin of other parts of face: Secondary | ICD-10-CM | POA: Diagnosis not present

## 2021-05-04 ENCOUNTER — Ambulatory Visit (INDEPENDENT_AMBULATORY_CARE_PROVIDER_SITE_OTHER): Payer: PPO

## 2021-05-04 DIAGNOSIS — Z0000 Encounter for general adult medical examination without abnormal findings: Secondary | ICD-10-CM | POA: Diagnosis not present

## 2021-05-04 NOTE — Progress Notes (Signed)
Subjective:    Elizabeth Bradley is a 87 y.o. female who presents for a Welcome to Medicare exam.   I connected with  CYBELE FOLLMAN on 07/22/21 by a audio enabled telemedicine application and verified that I am speaking with the correct person using two identifiers.   I discussed the limitations of evaluation and management by telemedicine. The patient expressed understanding and agreed to proceed.  Persons participating in visit: Elizabeth Bradley (patient) and Eugenia Mcalpine, LPN  Location of patient: Home Location of provider: Office   Review of Systems Defer to PCP        Objective:    Today's Vitals   05/04/21 0950  PainSc: 0-No pain  There is no height or weight on file to calculate BMI.  Medications Outpatient Encounter Medications as of 05/04/2021  Medication Sig   amLODipine (NORVASC) 2.5 MG tablet TAKE 1 TABLET BY MOUTH ONCE DAILY.   cetirizine (ZYRTEC) 10 MG chewable tablet Chew 10 mg by mouth daily.   Cholecalciferol (VITAMIN D-3) 25 MCG (1000 UT) CAPS Take by mouth.   clopidogrel (PLAVIX) 75 MG tablet Take 1 tablet (75 mg total) by mouth daily.   rosuvastatin (CRESTOR) 5 MG tablet Take 1 tablet (5 mg total) by mouth 3 (three) times a week.   [DISCONTINUED] famotidine (PEPCID) 20 MG tablet Take 1 tablet (20 mg total) by mouth daily.   [DISCONTINUED] levothyroxine (SYNTHROID) 25 MCG tablet Take 1 tablet (25 mcg total) by mouth daily.   diltiazem (TIAZAC) 120 MG 24 hr capsule Take 1 capsule by mouth daily.   No facility-administered encounter medications on file as of 05/04/2021.     History: Past Medical History:  Diagnosis Date   Atrophy of thyroid    Cerebrovascular accident (CVA) ([place])    Essential hypertension    GERD (gastroesophageal reflux disease)    History of skin cancer    Ischemic optic neuropathy of left eye    Low back pain    Mixed hyperlipidemia    Osteoarthritis    Stroke ([place])    Vitamin D deficiency    Past Surgical History:   Procedure Laterality Date   ABDOMINAL HYSTERECTOMY     SALPINGOOPHORECTOMY     THYROIDECTOMY      Family History  Problem Relation Age of Onset   Hypertension Mother    Heart failure Mother    Heart failure Father    Cancer Sister        LUNG   Heart failure Brother    Hypertension Brother    Cancer Brother    Social History   Occupational History   Not on file  Tobacco Use   Smoking status: Never   Smokeless tobacco: Never  Substance and Sexual Activity   Alcohol use: Never   Drug use: Never   Sexual activity: Not Currently    Tobacco Counseling Counseling given: Not Answered   Immunizations and Health Maintenance Immunization History  Administered Date(s) Administered   Fluad Quad(high Dose 65+) 07/10/2020   Influenza Split 07/10/2020   Influenza-Unspecified 06/07/2018   Moderna Sars-Covid-2 Vaccination 10/20/2019, 11/15/2019   Pneumococcal Conjugate-13 05/26/2017   Pneumococcal Polysaccharide-23 01/29/2021   Zoster Recombinat (Shingrix) 05/26/2017   Health Maintenance Due  Topic Date Due   TETANUS/TDAP  Never done   Zoster Vaccines- Shingrix (2 of 2) 07/21/2017   COVID-19 Vaccine (3 - Booster for Moderna series) 01/10/2020   INFLUENZA VACCINE  04/07/2021    Activities of Daily Living In your present state  of health, do you have any difficulty performing the following activities: 05/30/2021  Hearing? N  Vision? N  Difficulty concentrating or making decisions? N  Walking or climbing stairs? N  Dressing or bathing? N  Doing errands, shopping? N  Some recent data might be hidden    Physical Exam  (optional), or other factors deemed appropriate based on the beneficiary's medical and social history and current clinical standards.  Advanced Directives:      Assessment:    This is a routine wellness examination for this patient .   Vision/Hearing screen No results found.  Dietary issues and exercise activities discussed:      Goals   None     Depression Screen PHQ 2/9 Scores 05/30/2021 05/04/2021 01/29/2021 10/23/2020  PHQ - 2 Score 0 0 0 0     Fall Risk Fall Risk  05/30/2021  Falls in the past year? 0  Number falls in past yr: 0  Injury with Fall? 0  Risk for fall due to : No Fall Risks  Follow up Falls evaluation completed    Cognitive Function:        Patient Care Team: Rochel Brome, MD as PCP - General (Family Medicine)     Plan:     I have personally reviewed and noted the following in the patient's chart:   Medical and social history Use of alcohol, tobacco or illicit drugs  Current medications and supplements Functional ability and status Nutritional status Physical activity Advanced directives List of other physicians Hospitalizations, surgeries, and ER visits in previous 12 months Vitals Screenings to include cognitive, depression, and falls Referrals and appointments  In addition, I have reviewed and discussed with patient certain preventive protocols, quality metrics, and best practice recommendations. A written personalized care plan for preventive services as well as general preventive health recommendations were provided to patient.     Eugenia Mcalpine, LPN [id number]      Subjective:   Elizabeth Bradley is a 87 y.o. female who presents for an Initial Medicare Annual Wellness Visit.  Review of Systems           Objective:    Today's Vitals   05/04/21 0950  PainSc: 0-No pain   There is no height or weight on file to calculate BMI.  Advanced Directives 09/09/2019  Does Patient Have a Medical Advance Directive? No    Current Medications (verified) Outpatient Encounter Medications as of 05/04/2021  Medication Sig   amLODipine (NORVASC) 2.5 MG tablet TAKE 1 TABLET BY MOUTH ONCE DAILY.   cetirizine (ZYRTEC) 10 MG chewable tablet Chew 10 mg by mouth daily.   Cholecalciferol (VITAMIN D-3) 25 MCG (1000 UT) CAPS Take by mouth.   clopidogrel (PLAVIX) 75 MG tablet Take 1 tablet (75  mg total) by mouth daily.   rosuvastatin (CRESTOR) 5 MG tablet Take 1 tablet (5 mg total) by mouth 3 (three) times a week.   [DISCONTINUED] famotidine (PEPCID) 20 MG tablet Take 1 tablet (20 mg total) by mouth daily.   [DISCONTINUED] levothyroxine (SYNTHROID) 25 MCG tablet Take 1 tablet (25 mcg total) by mouth daily.   diltiazem (TIAZAC) 120 MG 24 hr capsule Take 1 capsule by mouth daily.   No facility-administered encounter medications on file as of 05/04/2021.    Allergies (verified) Simvastatin, Adhesive [tape], Iodinated diagnostic agents, Penicillins, and Prednisone   History: Past Medical History:  Diagnosis Date   Atrophy of thyroid    Cerebrovascular accident (CVA) [facility])    Essential hypertension  GERD (gastroesophageal reflux disease)    History of skin cancer    Ischemic optic neuropathy of left eye    Low back pain    Mixed hyperlipidemia    Osteoarthritis    Stroke ([place])    Vitamin D deficiency    Past Surgical History:  Procedure Laterality Date   ABDOMINAL HYSTERECTOMY     SALPINGOOPHORECTOMY     THYROIDECTOMY     Family History  Problem Relation Age of Onset   Hypertension Mother    Heart failure Mother    Heart failure Father    Cancer Sister        LUNG   Heart failure Brother    Hypertension Brother    Cancer Brother    Social History   Socioeconomic History   Marital status: Widowed    Spouse name: Not on file   Number of children: Not on file   Years of education: Not on file   Highest education level: Not on file  Occupational History   Not on file  Tobacco Use   Smoking status: Never   Smokeless tobacco: Never  Substance and Sexual Activity   Alcohol use: Never   Drug use: Never   Sexual activity: Not Currently  Other Topics Concern   Not on file  Social History Narrative   Not on file   Social Determinants of Health   Financial Resource Strain: Not on file  Food Insecurity: Not on file  Transportation Needs: Not on file   Physical Activity: Not on file  Stress: Not on file  Social Connections: Not on file    Tobacco Counseling Counseling given: Not Answered   Clinical Intake:  Pre-visit preparation completed: Yes  Pain : No/denies pain Pain Score: 0-No pain     Nutritional Risks: None Diabetes: No  How often do you need to have someone help you when you read instructions, pamphlets, or other written materials from your doctor or pharmacy?: 1 - Never  Diabetic?No  Interpreter Needed?: No      Activities of Daily Living In your present state of health, do you have any difficulty performing the following activities: 05/30/2021  Hearing? N  Vision? N  Difficulty concentrating or making decisions? N  Walking or climbing stairs? N  Dressing or bathing? N  Doing errands, shopping? N  Some recent data might be hidden    Patient Care Team: CoxElnita Maxwell, MD as PCP - General (Family Medicine)  Indicate any recent Medical Services you may have received from other than [facility] providers in the past year (date may be approximate).     Assessment:   This is a routine wellness examination for Elizabeth Bradley.  Hearing/Vision screen No results found.  Dietary issues and exercise activities discussed:     Goals Addressed   None   Depression Screen PHQ 2/9 Scores 05/30/2021 05/04/2021 01/29/2021 10/23/2020 07/10/2020 12/10/2019  PHQ - 2 Score 0 0 0 0 0 0    Fall Risk Fall Risk  05/30/2021 05/04/2021 01/29/2021 10/23/2020 07/10/2020  Falls in the past year? 0 0 0 0 0  Number falls in past yr: 0 0 0 0 0  Injury with Fall? 0 0 0 0 -  Risk for fall due to : No Fall Risks No Fall Risks - No Fall Risks -  Follow up Falls evaluation completed - - Falls evaluation completed;Falls prevention discussed -    FALL RISK PREVENTION PERTAINING TO THE HOME:  Any stairs in or around the home?  No  If so, are there any without handrails?  N/A Home free of loose throw rugs in walkways, pet beds, electrical cords, etc? No   Adequate lighting in your home to reduce risk of falls? Yes   ASSISTIVE DEVICES UTILIZED TO PREVENT FALLS:  Life alert? No  Use of a cane, walker or w/c? No  Grab bars in the bathroom? Yes  Shower chair or bench in shower? No  Elevated toilet seat or a handicapped toilet? No   TIMED UP AND GO:  Was the test performed? No .  Length of time to ambulate 10 feet: N/A sec. N/S    Cognitive Function:        Immunizations Immunization History  Administered Date(s) Administered   Fluad Quad(high Dose 65+) 07/10/2020   Influenza Split 07/10/2020   Influenza-Unspecified 06/07/2018   Moderna Sars-Covid-2 Vaccination 10/20/2019, 11/15/2019   Pneumococcal Conjugate-13 05/26/2017   Pneumococcal Polysaccharide-23 01/29/2021   Zoster Recombinat (Shingrix) 05/26/2017    TDAP status: Due, Education has been provided regarding the importance of this vaccine. Advised may receive this vaccine at local pharmacy or Health Dept. Aware to provide a copy of the vaccination record if obtained from local pharmacy or Health Dept. Verbalized acceptance and understanding.  Flu Vaccine status: Up to date  Pneumococcal vaccine status: Up to date  Covid-19 vaccine status: Declined, Education has been provided regarding the importance of this vaccine but patient still declined. Advised may receive this vaccine at local pharmacy or Health Dept.or vaccine clinic. Aware to provide a copy of the vaccination record if obtained from local pharmacy or Health Dept. Verbalized acceptance and understanding.  Qualifies for Shingles Vaccine? Yes   Zostavax completed No   Shingrix Completed?: No.    Education has been provided regarding the importance of this vaccine. Patient has been advised to call insurance company to determine out of pocket expense if they have not yet received this vaccine. Advised may also receive vaccine at local pharmacy or Health Dept. Verbalized acceptance and understanding.  Screening  Tests Health Maintenance  Topic Date Due   TETANUS/TDAP  Never done   Zoster Vaccines- Shingrix (2 of 2) 07/21/2017   COVID-19 Vaccine (3 - Booster for Moderna series) 01/10/2020   INFLUENZA VACCINE  04/07/2021   Pneumonia Vaccine 101+ Years old  Completed   DEXA SCAN  Completed   HPV VACCINES  Aged Out    Health Maintenance  Health Maintenance Due  Topic Date Due   TETANUS/TDAP  Never done   Zoster Vaccines- Shingrix (2 of 2) 07/21/2017   COVID-19 Vaccine (3 - Booster for Moderna series) 01/10/2020   INFLUENZA VACCINE  04/07/2021    Colorectal cancer screening: Referral to GI placed  . Pt aware the office will call re: appt.  Mammogram status: Ordered 05/04/2021. Pt provided with contact info and advised to call to schedule appt.   Bone Density status: Completed 12/06/2013. Results reflect: Bone density results: OSTEOPENIA. Repeat every 2-3 years.  Lung Cancer Screening: (Low Dose CT Chest recommended if Age 66-80 years, 30 pack-year currently smoking OR have quit w/in 15years.) does not qualify.   Lung Cancer Screening Referral: n/a  Additional Screening:  Hepatitis C Screening: does not qualify; Completed n/a  Vision Screening: Recommended annual ophthalmology exams for early detection of glaucoma and other disorders of the eye. Is the patient up to date with their annual eye exam?  Yes  Who is the provider or what is the name of the office in which the  patient attends annual eye exams? [facility] If pt is not established with a provider, would they like to be referred to a provider to establish care?  N/A .   Dental Screening: Recommended annual dental exams for proper oral hygiene  Community Resource Referral / Chronic Care Management: CRR required this visit?  No   CCM required this visit?  No      Plan:     I have personally reviewed and noted the following in the patient's chart:   Medical and social history Use of alcohol, tobacco or illicit drugs   Current medications and supplements including opioid prescriptions. Patient is not currently taking opioid prescriptions. Functional ability and status Nutritional status Physical activity Advanced directives List of other physicians Hospitalizations, surgeries, and ER visits in previous 12 months Vitals Screenings to include cognitive, depression, and falls Referrals and appointments  In addition, I have reviewed and discussed with patient certain preventive protocols, quality metrics, and best practice recommendations. A written personalized care plan for preventive services as well as general preventive health recommendations were provided to patient.     Eugenia Mcalpine, LPN   [id number]   Nurse Notes: Non Face to Face 45 minute visit encounter.   Ms. Coble , Thank you for taking time to come for your Medicare Wellness Visit. I appreciate your ongoing commitment to your health goals. Please review the following plan we discussed and let me know if I can assist you in the future.   These are the goals we discussed:  Goals   None     This is a list of the screening recommended for you and due dates:  Health Maintenance  Topic Date Due   Tetanus Vaccine  Never done   Zoster (Shingles) Vaccine (2 of 2) 07/21/2017   COVID-19 Vaccine (3 - Booster for Moderna series) 01/10/2020   Flu Shot  04/07/2021   Pneumonia Vaccine  Completed   DEXA scan (bone density measurement)  Completed   HPV Vaccine  Aged Out

## 2021-05-04 NOTE — Patient Instructions (Signed)
Health Maintenance, Female Adopting a healthy lifestyle and getting preventive care are important in promoting health and wellness. Ask your health care provider about: The right schedule for you to have regular tests and exams. Things you can do on your own to prevent diseases and keep yourself healthy. What should I know about diet, weight, and exercise? Eat a healthy diet  Eat a diet that includes plenty of vegetables, fruits, low-fat dairy products, and lean protein. Do not eat a lot of foods that are high in solid fats, added sugars, or sodium.  Maintain a healthy weight Body mass index (BMI) is used to identify weight problems. It estimates body fat based on height and weight. Your health care provider can help determineyour BMI and help you achieve or maintain a healthy weight. Get regular exercise Get regular exercise. This is one of the most important things you can do for your health. Most adults should: Exercise for at least 150 minutes each week. The exercise should increase your heart rate and make you sweat (moderate-intensity exercise). Do strengthening exercises at least twice a week. This is in addition to the moderate-intensity exercise. Spend less time sitting. Even light physical activity can be beneficial. Watch cholesterol and blood lipids Have your blood tested for lipids and cholesterol at 85 years of age, then havethis test every 5 years. Have your cholesterol levels checked more often if: Your lipid or cholesterol levels are high. You are older than 85 years of age. You are at high risk for heart disease. What should I know about cancer screening? Depending on your health history and family history, you may need to have cancer screening at various ages. This may include screening for: Breast cancer. Cervical cancer. Colorectal cancer. Skin cancer. Lung cancer. What should I know about heart disease, diabetes, and high blood pressure? Blood pressure and heart  disease High blood pressure causes heart disease and increases the risk of stroke. This is more likely to develop in people who have high blood pressure readings, are of African descent, or are overweight. Have your blood pressure checked: Every 3-5 years if you are 18-39 years of age. Every year if you are 40 years old or older. Diabetes Have regular diabetes screenings. This checks your fasting blood sugar level. Have the screening done: Once every three years after age 40 if you are at a normal weight and have a low risk for diabetes. More often and at a younger age if you are overweight or have a high risk for diabetes. What should I know about preventing infection? Hepatitis B If you have a higher risk for hepatitis B, you should be screened for this virus. Talk with your health care provider to find out if you are at risk forhepatitis B infection. Hepatitis C Testing is recommended for: Everyone born from 1945 through 1965. Anyone with known risk factors for hepatitis C. Sexually transmitted infections (STIs) Get screened for STIs, including gonorrhea and chlamydia, if: You are sexually active and are younger than 85 years of age. You are older than 85 years of age and your health care provider tells you that you are at risk for this type of infection. Your sexual activity has changed since you were last screened, and you are at increased risk for chlamydia or gonorrhea. Ask your health care provider if you are at risk. Ask your health care provider about whether you are at high risk for HIV. Your health care provider may recommend a prescription medicine to help   prevent HIV infection. If you choose to take medicine to prevent HIV, you should first get tested for HIV. You should then be tested every 3 months for as long as you are taking the medicine. Pregnancy If you are about to stop having your period (premenopausal) and you may become pregnant, seek counseling before you get  pregnant. Take 400 to 800 micrograms (mcg) of folic acid every day if you become pregnant. Ask for birth control (contraception) if you want to prevent pregnancy. Osteoporosis and menopause Osteoporosis is a disease in which the bones lose minerals and strength with aging. This can result in bone fractures. If you are 65 years old or older, or if you are at risk for osteoporosis and fractures, ask your health care provider if you should: Be screened for bone loss. Take a calcium or vitamin D supplement to lower your risk of fractures. Be given hormone replacement therapy (HRT) to treat symptoms of menopause. Follow these instructions at home: Lifestyle Do not use any products that contain nicotine or tobacco, such as cigarettes, e-cigarettes, and chewing tobacco. If you need help quitting, ask your health care provider. Do not use street drugs. Do not share needles. Ask your health care provider for help if you need support or information about quitting drugs. Alcohol use Do not drink alcohol if: Your health care provider tells you not to drink. You are pregnant, may be pregnant, or are planning to become pregnant. If you drink alcohol: Limit how much you use to 0-1 drink a day. Limit intake if you are breastfeeding. Be aware of how much alcohol is in your drink. In the [place] one drink equals one 12 oz bottle of beer (355 mL), one 5 oz glass of wine (148 mL), or one 1 oz glass of hard liquor (44 mL). General instructions Schedule regular health, dental, and eye exams. Stay current with your vaccines. Tell your health care provider if: You often feel depressed. You have ever been abused or do not feel safe at home. Summary Adopting a healthy lifestyle and getting preventive care are important in promoting health and wellness. Follow your health care provider's instructions about healthy diet, exercising, and getting tested or screened for diseases. Follow your health care provider's  instructions on monitoring your cholesterol and blood pressure. This information is not intended to replace advice given to you by your health care provider. Make sure you discuss any questions you have with your healthcare provider. Document Revised: 08/17/2018 Document Reviewed: 08/17/2018 Elsevier Patient Education  2022 [employer]  

## 2021-05-27 ENCOUNTER — Other Ambulatory Visit: Payer: Self-pay

## 2021-05-27 ENCOUNTER — Ambulatory Visit (INDEPENDENT_AMBULATORY_CARE_PROVIDER_SITE_OTHER): Payer: PPO | Admitting: Family Medicine

## 2021-05-27 VITALS — BP 124/60 | HR 56 | Temp 96.5°F | Resp 16 | Wt 129.4 lb

## 2021-05-27 DIAGNOSIS — M7062 Trochanteric bursitis, left hip: Secondary | ICD-10-CM

## 2021-05-27 DIAGNOSIS — R600 Localized edema: Secondary | ICD-10-CM | POA: Diagnosis not present

## 2021-05-27 NOTE — Progress Notes (Signed)
Acute Office Visit  Subjective:    Patient ID: Elizabeth Bradley, female    DOB: Jan 14, 1930, 87 y.o.   MRN: [id number]  Chief Complaint  Patient presents with   Leg Pain   Hip Pain    HPI Patient is an 87 year old white female who presents complaining of left hip and leg pain.  Left leg is swelling.  This is been going on for approximately 1 week.  Used a heating pad and Tylenol.  Patient denies any injury.  Past Medical History:  Diagnosis Date   Atrophy of thyroid    Cerebrovascular accident (CVA) ([place])    Essential hypertension    GERD (gastroesophageal reflux disease)    History of skin cancer    Ischemic optic neuropathy of left eye    Low back pain    Mixed hyperlipidemia    Osteoarthritis    Stroke ([place])    Vitamin D deficiency     Past Surgical History:  Procedure Laterality Date   ABDOMINAL HYSTERECTOMY     SALPINGOOPHORECTOMY     THYROIDECTOMY      Family History  Problem Relation Age of Onset   Hypertension Mother    Heart failure Mother    Heart failure Father    Cancer Sister        LUNG   Heart failure Brother    Hypertension Brother    Cancer Brother     Social History   Socioeconomic History   Marital status: Widowed    Spouse name: Not on file   Number of children: Not on file   Years of education: Not on file   Highest education level: Not on file  Occupational History   Not on file  Tobacco Use   Smoking status: Never   Smokeless tobacco: Never  Substance and Sexual Activity   Alcohol use: Never   Drug use: Never   Sexual activity: Not Currently  Other Topics Concern   Not on file  Social History Narrative   Not on file   Social Determinants of Health   Financial Resource Strain: Not on file  Food Insecurity: Not on file  Transportation Needs: Not on file  Physical Activity: Not on file  Stress: Not on file  Social Connections: Not on file  Intimate Partner Violence: Not on file    Outpatient Medications Prior to  Visit  Medication Sig Dispense Refill   amLODipine (NORVASC) 2.5 MG tablet TAKE 1 TABLET BY MOUTH ONCE DAILY. 90 tablet 1   cetirizine (ZYRTEC) 10 MG chewable tablet Chew 10 mg by mouth daily.     Cholecalciferol (VITAMIN D-3) 25 MCG (1000 UT) CAPS Take by mouth.     clopidogrel (PLAVIX) 75 MG tablet Take 1 tablet (75 mg total) by mouth daily. 90 tablet 3   diltiazem (TIAZAC) 120 MG 24 hr capsule Take 1 capsule by mouth daily.     rosuvastatin (CRESTOR) 5 MG tablet Take 1 tablet (5 mg total) by mouth 3 (three) times a week. 1 tablet 0   famotidine (PEPCID) 20 MG tablet Take 1 tablet (20 mg total) by mouth daily. 90 tablet 3   levothyroxine (SYNTHROID) 25 MCG tablet Take 1 tablet (25 mcg total) by mouth daily. 90 tablet 1   No facility-administered medications prior to visit.    Allergies  Allergen Reactions   Simvastatin Other (See Comments)    Muscle Cramps   Adhesive [Tape]    Iodinated Diagnostic Agents Hives and Other (See Comments)  Sneezing and hives 09/09/2019; needs 13 hr prep   Penicillins    Prednisone     Alopecia     Review of Systems  Constitutional:  Negative for chills and fever.  HENT:  Negative for congestion.   Respiratory:  Positive for cough. Negative for shortness of breath.   Cardiovascular:  Negative for chest pain and palpitations.  Musculoskeletal:  Positive for arthralgias and myalgias.      Objective:    Physical Exam Vitals reviewed.  Constitutional:      Appearance: Normal appearance.  Cardiovascular:     Rate and Rhythm: Normal rate and regular rhythm.     Heart sounds: Normal heart sounds.  Pulmonary:     Effort: Pulmonary effort is normal.     Breath sounds: Normal breath sounds.  Musculoskeletal:        General: Tenderness (Left trochanteric bursa is tender.  Negative left Homans.) present. No signs of injury. Normal range of motion.     Right lower leg: No edema.     Left lower leg: Edema present.  Skin:    Comments: Right gluteal  atrophy (likely post steroid injection)  Neurological:     Mental Status: She is alert and oriented to person, place, and time.  Psychiatric:        Mood and Affect: Mood normal.        Behavior: Behavior normal.    BP 124/60   Pulse (!) 56   Temp (!) 96.5 F (35.8 C)   Resp 16   Wt 129 lb 6.4 oz (58.7 kg)   BMI 26.14 kg/m  Wt Readings from Last 3 Encounters:  06/30/21 132 lb (59.9 kg)  05/30/21 128 lb (58.1 kg)  05/27/21 129 lb 6.4 oz (58.7 kg)    Health Maintenance Due  Topic Date Due   TETANUS/TDAP  Never done   Zoster Vaccines- Shingrix (2 of 2) 07/21/2017   COVID-19 Vaccine (3 - Booster for Moderna series) 01/10/2020   INFLUENZA VACCINE  04/07/2021    There are no preventive care reminders to display for this patient.   Lab Results  Component Value Date   TSH 2.980 01/29/2021   Lab Results  Component Value Date   WBC 5.2 01/29/2021   HGB 11.9 01/29/2021   HCT 35.9 01/29/2021   MCV 90 01/29/2021   PLT 202 01/29/2021   Lab Results  Component Value Date   NA 143 01/29/2021   K 3.9 01/29/2021   CO2 20 01/29/2021   GLUCOSE 96 01/29/2021   BUN 23 01/29/2021   CREATININE 1.24 (H) 01/29/2021   BILITOT 0.6 01/29/2021   ALKPHOS 85 01/29/2021   AST 15 01/29/2021   ALT 11 01/29/2021   PROT 6.6 01/29/2021   ALBUMIN 4.2 01/29/2021   CALCIUM 9.7 01/29/2021   ANIONGAP 10 09/09/2019   EGFR 42 (L) 01/29/2021   Lab Results  Component Value Date   CHOL 193 01/29/2021   Lab Results  Component Value Date   HDL 93 01/29/2021   Lab Results  Component Value Date   LDLCALC 81 01/29/2021   Lab Results  Component Value Date   TRIG 110 01/29/2021   Lab Results  Component Value Date   CHOLHDL 2.1 01/29/2021   No results found for: HGBA1C     Assessment & Plan:   Problem List Items Addressed This Visit       Musculoskeletal and Integument   Greater trochanteric bursitis of left hip    Recommend voltaren gel  qid prn. Tylenol Risks were discussed  including bleeding, infection, increase in sugars if diabetic, atrophy at site of injection, and increased pain.  After consent was obtained, using sterile technique the left trochanteric bursa was prepped with alcohol.  The joint was injected with Kenalog 40 mg and lidocaine 5 ml.  The procedure was well tolerated.   The patient is asked to continue to rest the joint for a few more days before resuming regular activities.  It may be more painful for the first 1-2 days.  Watch for fever, or increased swelling or persistent pain in the joint. Call or return to clinic prn if such symptoms occur or there is failure to improve as anticipated.       Relevant Medications   triamcinolone acetonide (KENALOG-40) injection 40 mg     Other   Edema of left lower extremity - Primary    Order doppler. Came back negative.       Relevant Orders   DOPPLER VENOUS LEGS BILATERAL (Completed)     Orders Placed This Encounter  Procedures   DOPPLER VENOUS LEGS BILATERAL    Follow-up: No follow-ups on file.  An After Visit Summary was printed and given to the patient.  Rochel Brome, MD [facility] [phone]

## 2021-05-29 ENCOUNTER — Telehealth: Payer: Self-pay

## 2021-05-29 NOTE — Telephone Encounter (Signed)
Elizabeth Bradley called and complains of continued pain in her back and leg.  She is requesting an injection if possible.  Symptoms discussed with Dr. Tobie Poet and follow-up scheduled for tomorrow morning.

## 2021-05-30 ENCOUNTER — Ambulatory Visit (INDEPENDENT_AMBULATORY_CARE_PROVIDER_SITE_OTHER): Payer: PPO | Admitting: Family Medicine

## 2021-05-30 ENCOUNTER — Encounter: Payer: Self-pay | Admitting: Family Medicine

## 2021-05-30 ENCOUNTER — Other Ambulatory Visit: Payer: Self-pay

## 2021-05-30 VITALS — BP 128/64 | HR 74 | Temp 95.8°F | Ht 59.0 in | Wt 128.0 lb

## 2021-05-30 DIAGNOSIS — M7122 Synovial cyst of popliteal space [Baker], left knee: Secondary | ICD-10-CM | POA: Diagnosis not present

## 2021-05-30 DIAGNOSIS — M461 Sacroiliitis, not elsewhere classified: Secondary | ICD-10-CM

## 2021-05-30 NOTE — Progress Notes (Signed)
Subjective:  Patient ID: Elizabeth Bradley, female    DOB: [birth year]  Age: 87 y.o. MRN: [id number]  Chief Complaint  Patient presents with   Leg Pain   HPI Patient c/o left leg pain and requesting an injx. Pt is complaining of left buttock, radiates down left leg, discomfort. Also has discomfort behind left knee. Feels a lump. Left leg always swells a little. [place] of left leg was negative earlier this week.  Current Outpatient Medications on File Prior to Visit  Medication Sig Dispense Refill   amLODipine (NORVASC) 2.5 MG tablet TAKE 1 TABLET BY MOUTH ONCE DAILY. 90 tablet 1   cetirizine (ZYRTEC) 10 MG chewable tablet Chew 10 mg by mouth daily.     Cholecalciferol (VITAMIN D-3) 25 MCG (1000 UT) CAPS Take by mouth.     clopidogrel (PLAVIX) 75 MG tablet Take 1 tablet (75 mg total) by mouth daily. 90 tablet 3   diltiazem (TIAZAC) 120 MG 24 hr capsule Take 1 capsule by mouth daily.     famotidine (PEPCID) 20 MG tablet Take 1 tablet (20 mg total) by mouth daily. 90 tablet 3   levothyroxine (SYNTHROID) 25 MCG tablet Take 1 tablet (25 mcg total) by mouth daily. 90 tablet 1   rosuvastatin (CRESTOR) 5 MG tablet Take 1 tablet (5 mg total) by mouth 3 (three) times a week. 1 tablet 0   No current facility-administered medications on file prior to visit.   Past Medical History:  Diagnosis Date   Atrophy of thyroid    Cerebrovascular accident (CVA) ([place])    Essential hypertension    GERD (gastroesophageal reflux disease)    History of skin cancer    Ischemic optic neuropathy of left eye    Low back pain    Mixed hyperlipidemia    Osteoarthritis    Stroke ([place])    Vitamin D deficiency    Past Surgical History:  Procedure Laterality Date   ABDOMINAL HYSTERECTOMY     SALPINGOOPHORECTOMY     THYROIDECTOMY      Family History  Problem Relation Age of Onset   Hypertension Mother    Heart failure Mother    Heart failure Father    Cancer Sister        LUNG   Heart failure Brother     Hypertension Brother    Cancer Brother    Social History   Socioeconomic History   Marital status: Widowed    Spouse name: Not on file   Number of children: Not on file   Years of education: Not on file   Highest education level: Not on file  Occupational History   Not on file  Tobacco Use   Smoking status: Never   Smokeless tobacco: Never  Substance and Sexual Activity   Alcohol use: Never   Drug use: Never   Sexual activity: Not Currently  Other Topics Concern   Not on file  Social History Narrative   Not on file   Social Determinants of Health   Financial Resource Strain: Not on file  Food Insecurity: Not on file  Transportation Needs: Not on file  Physical Activity: Not on file  Stress: Not on file  Social Connections: Not on file    Review of Systems  Constitutional:  Negative for chills, fatigue and fever.  HENT:  Negative for congestion, ear pain, postnasal drip, rhinorrhea, sinus pressure, sinus pain and sore throat.   Respiratory:  Negative for cough and shortness of breath.   Cardiovascular:  Negative for chest pain.  Musculoskeletal:        Leg pain (left)    Objective:  BP 128/64   Pulse 74   Temp (!) 95.8 F (35.4 C)   Ht 4\' 11"  (1.499 m)   Wt 128 lb (58.1 kg)   SpO2 100%   BMI 25.85 kg/m   BP/Weight 05/30/2021 05/27/2021 3/64/6803  Systolic BP 212 248 250  Diastolic BP 64 60 64  Wt. (Lbs) 128 129.4 130  BMI 25.85 26.14 26.26    Physical Exam Vitals reviewed.  Constitutional:      Appearance: Normal appearance.  Musculoskeletal:        General: Tenderness (left buttock over SI joint. negative SLR. posterior left knee (bakers cyst),) present. Normal range of motion.  Neurological:     Mental Status: She is alert and oriented to person, place, and time.  Psychiatric:        Mood and Affect: Mood normal.        Behavior: Behavior normal.   Diabetic Foot Exam - Simple   No data filed      Lab Results  Component Value Date   WBC  5.2 01/29/2021   HGB 11.9 01/29/2021   HCT 35.9 01/29/2021   PLT 202 01/29/2021   GLUCOSE 96 01/29/2021   CHOL 193 01/29/2021   TRIG 110 01/29/2021   HDL 93 01/29/2021   LDLCALC 81 01/29/2021   ALT 11 01/29/2021   AST 15 01/29/2021   NA 143 01/29/2021   K 3.9 01/29/2021   CL 107 (H) 01/29/2021   CREATININE 1.24 (H) 01/29/2021   BUN 23 01/29/2021   CO2 20 01/29/2021   TSH 2.980 01/29/2021      Assessment & Plan:   Problem List Items Addressed This Visit       Musculoskeletal and Integument   Sacroiliitis ([place]) - Primary    Recommend voltaren gel.  Recommend ice/heat.  Recommend stretching exercises.  Refused physical therapy/orthopedics.       Synovial cyst of left popliteal space    May call back if wishes to go ortho.     .  No orders of the defined types were placed in this encounter.   No orders of the defined types were placed in this encounter.    Follow-up: Return if symptoms worsen or fail to improve.  An After Visit Summary was printed and given to the patient.  Rochel Brome, MD [facility] [phone]

## 2021-05-30 NOTE — Assessment & Plan Note (Signed)
Recommend voltaren gel.  Recommend ice/heat.  Recommend stretching exercises.  Refused physical therapy/orthopedics.

## 2021-05-30 NOTE — Patient Instructions (Signed)
Ice/heat. Diclofenac gel. Stretching.

## 2021-05-30 NOTE — Assessment & Plan Note (Signed)
May call back if wishes to go ortho.

## 2021-06-10 ENCOUNTER — Encounter: Payer: Self-pay | Admitting: Family Medicine

## 2021-06-10 DIAGNOSIS — R600 Localized edema: Secondary | ICD-10-CM | POA: Insufficient documentation

## 2021-06-10 DIAGNOSIS — M7062 Trochanteric bursitis, left hip: Secondary | ICD-10-CM | POA: Insufficient documentation

## 2021-06-10 NOTE — Assessment & Plan Note (Addendum)
Recommend voltaren gel qid prn. Tylenol Risks were discussed including bleeding, infection, increase in sugars if diabetic, atrophy at site of injection, and increased pain.  After consent was obtained, using sterile technique the left trochanteric bursa was prepped with alcohol.  The joint was injected with Kenalog 40 mg and lidocaine 5 ml.  The procedure was well tolerated.   The patient is asked to continue to rest the joint for a few more days before resuming regular activities.  It may be more painful for the first 1-2 days.  Watch for fever, or increased swelling or persistent pain in the joint. Call or return to clinic prn if such symptoms occur or there is failure to improve as anticipated.

## 2021-06-10 NOTE — Assessment & Plan Note (Signed)
Order doppler. Came back negative.

## 2021-06-16 ENCOUNTER — Other Ambulatory Visit: Payer: Self-pay | Admitting: Family Medicine

## 2021-06-16 DIAGNOSIS — K219 Gastro-esophageal reflux disease without esophagitis: Secondary | ICD-10-CM

## 2021-06-16 NOTE — Telephone Encounter (Signed)
Refill sent to pharmacy.   

## 2021-06-29 DIAGNOSIS — J302 Other seasonal allergic rhinitis: Secondary | ICD-10-CM | POA: Diagnosis not present

## 2021-06-29 DIAGNOSIS — S81802A Unspecified open wound, left lower leg, initial encounter: Secondary | ICD-10-CM | POA: Diagnosis not present

## 2021-06-29 DIAGNOSIS — J209 Acute bronchitis, unspecified: Secondary | ICD-10-CM | POA: Diagnosis not present

## 2021-06-30 ENCOUNTER — Ambulatory Visit (INDEPENDENT_AMBULATORY_CARE_PROVIDER_SITE_OTHER): Payer: PPO | Admitting: Nurse Practitioner

## 2021-06-30 ENCOUNTER — Encounter: Payer: Self-pay | Admitting: Nurse Practitioner

## 2021-06-30 VITALS — BP 130/60 | HR 80 | Temp 97.1°F | Ht 59.0 in | Wt 132.0 lb

## 2021-06-30 DIAGNOSIS — W548XXA Other contact with dog, initial encounter: Secondary | ICD-10-CM

## 2021-06-30 DIAGNOSIS — J018 Other acute sinusitis: Secondary | ICD-10-CM

## 2021-06-30 DIAGNOSIS — J029 Acute pharyngitis, unspecified: Secondary | ICD-10-CM | POA: Diagnosis not present

## 2021-06-30 DIAGNOSIS — J302 Other seasonal allergic rhinitis: Secondary | ICD-10-CM | POA: Diagnosis not present

## 2021-06-30 LAB — POC COVID19 BINAXNOW: SARS Coronavirus 2 Ag: NEGATIVE

## 2021-06-30 LAB — POCT INFLUENZA A/B
Influenza A, POC: NEGATIVE
Influenza B, POC: NEGATIVE

## 2021-06-30 LAB — POCT RAPID STREP A (OFFICE): Rapid Strep A Screen: NEGATIVE

## 2021-06-30 MED ORDER — TRIAMCINOLONE ACETONIDE 40 MG/ML IJ SUSP
40.0000 mg | Freq: Once | INTRAMUSCULAR | Status: AC
  Administered 2021-06-30: 40 mg via INTRAMUSCULAR

## 2021-06-30 NOTE — Patient Instructions (Addendum)
Continue Zyrtec 10 mg daily Kenalog 40 mg injection given in office Avoid allergies as much as possible Keep left lower leg wound covered with antibiotic ointment and non-adherent dressing; monitor for signs/symptoms of infection Follow-up as needed   Sinusitis, Adult Sinusitis is soreness and swelling (inflammation) of your sinuses. Sinuses are hollow spaces in the bones around your face. They are located: Around your eyes. In the middle of your forehead. Behind your nose. In your cheekbones. Your sinuses and nasal passages are lined with a fluid called mucus. Mucus drains out of your sinuses. Swelling can trap mucus in your sinuses. This lets germs (bacteria, virus, or fungus) grow, which leads to infection. Most of the time, this condition is caused by a virus. What are the causes? This condition is caused by: Allergies. Asthma. Germs. Things that block your nose or sinuses. Growths in the nose (nasal polyps). Chemicals or irritants in the air. Fungus (rare). What increases the risk? You are more likely to develop this condition if: You have a weak body defense system (immune system). You do a lot of swimming or diving. You use nasal sprays too much. You smoke. What are the signs or symptoms? The main symptoms of this condition are pain and a feeling of pressure around the sinuses. Other symptoms include: Stuffy nose (congestion). Runny nose (drainage). Swelling and warmth in the sinuses. Headache. Toothache. A cough that may get worse at night. Mucus that collects in the throat or the back of the nose (postnasal drip). Being unable to smell and taste. Being very tired (fatigue). A fever. Sore throat. Bad breath. How is this diagnosed? This condition is diagnosed based on: Your symptoms. Your medical history. A physical exam. Tests to find out if your condition is short-term (acute) or long-term (chronic). Your doctor may: Check your nose for growths  (polyps). Check your sinuses using a tool that has a light (endoscope). Check for allergies or germs. Do imaging tests, such as an MRI or CT scan. How is this treated? Treatment for this condition depends on the cause and whether it is short-term or long-term. If caused by a virus, your symptoms should go away on their own within 10 days. You may be given medicines to relieve symptoms. They include: Medicines that shrink swollen tissue in the nose. Medicines that treat allergies (antihistamines). A spray that treats swelling of the nostrils.  Rinses that help get rid of thick mucus in your nose (nasal saline washes). If caused by bacteria, your doctor may wait to see if you will get better without treatment. You may be given antibiotic medicine if you have: A very bad infection. A weak body defense system. If caused by growths in the nose, you may need to have surgery. Follow these instructions at home: Medicines Take, use, or apply over-the-counter and prescription medicines only as told by your doctor. These may include nasal sprays. If you were prescribed an antibiotic medicine, take it as told by your doctor. Do not stop taking the antibiotic even if you start to feel better. Hydrate and humidify  Drink enough water to keep your pee (urine) pale yellow. Use a cool mist humidifier to keep the humidity level in your home above 50%. Breathe in steam for 10-15 minutes, 3-4 times a day, or as told by your doctor. You can do this in the bathroom while a hot shower is running. Try not to spend time in cool or dry air. Rest Rest as much as you can. Sleep with  your head raised (elevated). Make sure you get enough sleep each night. General instructions  Put a warm, moist washcloth on your face 3-4 times a day, or as often as told by your doctor. This will help with discomfort. Wash your hands often with soap and water. If there is no soap and water, use hand sanitizer. Do not smoke. Avoid  being around people who are smoking (secondhand smoke). Keep all follow-up visits as told by your doctor. This is important. Contact a doctor if: You have a fever. Your symptoms get worse. Your symptoms do not get better within 10 days. Get help right away if: You have a very bad headache. You cannot stop throwing up (vomiting). You have very bad pain or swelling around your face or eyes. You have trouble seeing. You feel confused. Your neck is stiff. You have trouble breathing. Summary Sinusitis is swelling of your sinuses. Sinuses are hollow spaces in the bones around your face. This condition is caused by tissues in your nose that become inflamed or swollen. This traps germs. These can lead to infection. If you were prescribed an antibiotic medicine, take it as told by your doctor. Do not stop taking it even if you start to feel better. Keep all follow-up visits as told by your doctor. This is important. This information is not intended to replace advice given to you by your health care provider. Make sure you discuss any questions you have with your health care provider. Document Revised: 01/24/2018 Document Reviewed: 01/24/2018 Elsevier Patient Education  2022 [employer].

## 2021-06-30 NOTE — Progress Notes (Signed)
Acute Office Visit  Subjective:    Patient ID: Elizabeth Bradley, female    DOB: [birth year], 87 y.o.   MRN: [id number]  Chief Complaint  Patient presents with   URI    HPI: Elizabeth Bradley is an 87 year old Caucasian female that presents for evaluation of sinus congestion, sore throat, rhinorrhea, and post-nasal-drip. Onset of symptoms was three days ago. Treatment has included OTC sinus medication. States Elizabeth Bradley has seasonal allergic rhinitis that is treated with Zyrtec 10 mg daily. Elizabeth Bradley was seen yesterday at Urgent Care. Requested a kenalog shot but they declined to.Elizabeth Bradley tells me that four days ago Elizabeth Bradley spent several hours outdoors exposed to ragweed and other environmental allergens. Elizabeth Bradley denies fever. COVID-19, flu, and strep test are negative.   Patient is in today for Upper respiratory symptoms Elizabeth Bradley complains of congestion and nasal congestion.with no fever, chills, night sweats or weight loss. Onset of symptoms was  2 days ago and staying constant.Elizabeth Bradley is drinking plenty of fluids.  Past history is significant for no history of pneumonia or bronchitis. Patient is non-smoker.     Patient complains of being hoarse. Elizabeth Bradley does take prevacid for acid reflux but does not take it daily.  Elizabeth Bradley tells me that her puppy scratched her left lower anterior leg three days ago, causing a skin tear. Initial treatment was hydrogen peroxide. States one of her neighbors bandaged it yesterday after rinsing it with alcohol. States Elizabeth Bradley is unable to assess the wound due to poor vision s/p 2 previous CVAs.   Past Medical History:  Diagnosis Date   Atrophy of thyroid    Cerebrovascular accident (CVA) ([place])    Essential hypertension    GERD (gastroesophageal reflux disease)    History of skin cancer    Ischemic optic neuropathy of left eye    Low back pain    Mixed hyperlipidemia    Osteoarthritis    Stroke ([place])    Vitamin D deficiency     Past Surgical History:  Procedure Laterality Date   ABDOMINAL HYSTERECTOMY      SALPINGOOPHORECTOMY     THYROIDECTOMY      Family History  Problem Relation Age of Onset   Hypertension Mother    Heart failure Mother    Heart failure Father    Cancer Sister        LUNG   Heart failure Brother    Hypertension Brother    Cancer Brother     Social History   Socioeconomic History   Marital status: Widowed    Spouse name: Not on file   Number of children: Not on file   Years of education: Not on file   Highest education level: Not on file  Occupational History   Not on file  Tobacco Use   Smoking status: Never   Smokeless tobacco: Never  Substance and Sexual Activity   Alcohol use: Never   Drug use: Never   Sexual activity: Not Currently  Other Topics Concern   Not on file  Social History Narrative   Not on file   Social Determinants of Health   Financial Resource Strain: Not on file  Food Insecurity: Not on file  Transportation Needs: Not on file  Physical Activity: Not on file  Stress: Not on file  Social Connections: Not on file  Intimate Partner Violence: Not on file    Outpatient Medications Prior to Visit  Medication Sig Dispense Refill   amLODipine (NORVASC) 2.5 MG tablet TAKE 1 TABLET BY MOUTH ONCE  DAILY. 90 tablet 1   cetirizine (ZYRTEC) 10 MG chewable tablet Chew 10 mg by mouth daily.     Cholecalciferol (VITAMIN D-3) 25 MCG (1000 UT) CAPS Take by mouth.     clopidogrel (PLAVIX) 75 MG tablet Take 1 tablet (75 mg total) by mouth daily. 90 tablet 3   diltiazem (TIAZAC) 120 MG 24 hr capsule Take 1 capsule by mouth daily.     famotidine (PEPCID) 20 MG tablet TAKE 1 TABLET BY MOUTH ONCE DAILY 90 tablet 3   levothyroxine (SYNTHROID) 25 MCG tablet TAKE 1 TABLET BY MOUTH ONCE DAILY. 90 tablet 1   rosuvastatin (CRESTOR) 5 MG tablet Take 1 tablet (5 mg total) by mouth 3 (three) times a week. 1 tablet 0   No facility-administered medications prior to visit.    Allergies  Allergen Reactions   Simvastatin Other (See Comments)    Muscle  Cramps   Adhesive [Tape]    Iodinated Diagnostic Agents Hives and Other (See Comments)    Sneezing and hives 09/09/2019; needs 13 hr prep   Penicillins    Prednisone     Alopecia     Review of Systems  Constitutional:  Positive for fatigue. Negative for chills and fever.  HENT:  Positive for congestion, postnasal drip, rhinorrhea, sinus pressure, sinus pain, sore throat and voice change. Negative for ear pain.   Eyes:  Positive for visual disturbance ("legally blind").  Respiratory:  Positive for cough. Negative for shortness of breath.   Cardiovascular:  Negative for chest pain.  Gastrointestinal:  Negative for diarrhea and nausea.  Endocrine: Negative.   Genitourinary: Negative.   Musculoskeletal: Negative.   Skin: Negative.   Allergic/Immunologic: Positive for environmental allergies.  Neurological:  Positive for headaches. Negative for dizziness.  Hematological: Negative.   Psychiatric/Behavioral: Negative.        Objective:    Physical Exam Vitals reviewed.  Constitutional:      Appearance: Normal appearance.  HENT:     Head: Normocephalic.     Right Ear: Tympanic membrane normal.     Left Ear: Tympanic membrane normal.     Nose: Congestion and rhinorrhea present.     Mouth/Throat:     Mouth: Mucous membranes are dry.     Pharynx: Posterior oropharyngeal erythema present.  Eyes:     Pupils: Pupils are equal, round, and reactive to light.  Cardiovascular:     Rate and Rhythm: Normal rate and regular rhythm.     Pulses: Normal pulses.     Heart sounds: Normal heart sounds.  Pulmonary:     Effort: Pulmonary effort is normal.     Breath sounds: Normal breath sounds.  Abdominal:     General: Bowel sounds are normal.     Palpations: Abdomen is soft.  Musculoskeletal:        General: Normal range of motion.     Cervical back: Neck supple.  Skin:    General: Skin is warm and dry.     Capillary Refill: Capillary refill takes less than 2 seconds.     Findings:  Wound present.          Comments: Left lower leg wound (skin tear) approximately 64mm in diameter, no drainage, erythema, warmth, or odor noted  Neurological:     General: No focal deficit present.     Mental Status: Elizabeth Bradley is alert and oriented to person, place, and time.  Psychiatric:        Mood and Affect: Mood normal.  Behavior: Behavior normal.    BP 130/60   Pulse 80   Temp (!) 97.1 F (36.2 C)   Ht $R'4\' 11"'AW$  (1.499 m)   Wt 132 lb (59.9 kg)   SpO2 99%   BMI 26.66 kg/m   Wt Readings from Last 3 Encounters:  05/30/21 128 lb (58.1 kg)  05/27/21 129 lb 6.4 oz (58.7 kg)  01/29/21 130 lb (59 kg)    Health Maintenance Due  Topic Date Due   TETANUS/TDAP  Never done   Zoster Vaccines- Shingrix (2 of 2) 07/21/2017   COVID-19 Vaccine (3 - Booster for Moderna series) 01/10/2020   INFLUENZA VACCINE  04/07/2021       Lab Results  Component Value Date   TSH 2.980 01/29/2021   Lab Results  Component Value Date   WBC 5.2 01/29/2021   HGB 11.9 01/29/2021   HCT 35.9 01/29/2021   MCV 90 01/29/2021   PLT 202 01/29/2021   Lab Results  Component Value Date   NA 143 01/29/2021   K 3.9 01/29/2021   CO2 20 01/29/2021   GLUCOSE 96 01/29/2021   BUN 23 01/29/2021   CREATININE 1.24 (H) 01/29/2021   BILITOT 0.6 01/29/2021   ALKPHOS 85 01/29/2021   AST 15 01/29/2021   ALT 11 01/29/2021   PROT 6.6 01/29/2021   ALBUMIN 4.2 01/29/2021   CALCIUM 9.7 01/29/2021   ANIONGAP 10 09/09/2019   EGFR 42 (L) 01/29/2021   Lab Results  Component Value Date   CHOL 193 01/29/2021   Lab Results  Component Value Date   HDL 93 01/29/2021   Lab Results  Component Value Date   LDLCALC 81 01/29/2021   Lab Results  Component Value Date   TRIG 110 01/29/2021   Lab Results  Component Value Date   CHOLHDL 2.1 01/29/2021        Assessment & Plan:   1. Acute non-recurrent sinusitis of other sinus -Kenalog 40 mg injection given in office  2. Seasonal allergies -continue Zyrtec  10 mg daily  3. Sore throat - POCT Influenza A/B - POCT rapid strep A - POC COVID-19 BinaxNow  4. Dog scratch  -Triple antibiotic ointment to left lower leg wound -Keep wound covered with non-adherent dressing -pt to return for Tdap due to steroid injection given this office visit   Continue Zyrtec 10 mg daily Kenalog 40 mg injection given in office Avoid allergies as much as possible Keep left lower leg wound covered with antibiotic ointment and non-adherent dressing; monitor for signs/symptoms of infection Follow-up as needed    Follow-up: PRN  An After Visit Summary was printed and given to the patient.   I, Rip Harbour, NP, have reviewed all documentation for this visit. The documentation on 06/30/21 for the exam, diagnosis, procedures, and orders are all accurate and complete.     Signed, Rip Harbour, NP [place] [phone]

## 2021-07-10 MED ORDER — FLUTICASONE PROPIONATE 50 MCG/ACT NA SUSP: 2.0000 | Freq: Every day | NASAL | 6 refills | Status: DC

## 2021-07-10 MED ORDER — TRIAMCINOLONE ACETONIDE 40 MG/ML IJ SUSP: 40.0000 mg | Freq: Once | INTRAMUSCULAR | Status: DC

## 2021-07-30 ENCOUNTER — Other Ambulatory Visit: Payer: Self-pay

## 2021-07-30 ENCOUNTER — Ambulatory Visit (INDEPENDENT_AMBULATORY_CARE_PROVIDER_SITE_OTHER): Payer: PPO | Admitting: Family Medicine

## 2021-07-30 VITALS — BP 138/68 | HR 84 | Temp 96.3°F | Resp 14 | Wt 126.0 lb

## 2021-07-30 DIAGNOSIS — I69398 Other sequelae of cerebral infarction: Secondary | ICD-10-CM | POA: Diagnosis not present

## 2021-07-30 DIAGNOSIS — E038 Other specified hypothyroidism: Secondary | ICD-10-CM | POA: Diagnosis not present

## 2021-07-30 DIAGNOSIS — Z23 Encounter for immunization: Secondary | ICD-10-CM

## 2021-07-30 DIAGNOSIS — I700 Atherosclerosis of aorta: Secondary | ICD-10-CM | POA: Diagnosis not present

## 2021-07-30 DIAGNOSIS — I10 Essential (primary) hypertension: Secondary | ICD-10-CM

## 2021-07-30 DIAGNOSIS — E782 Mixed hyperlipidemia: Secondary | ICD-10-CM | POA: Diagnosis not present

## 2021-07-30 DIAGNOSIS — I471 Supraventricular tachycardia, unspecified: Secondary | ICD-10-CM

## 2021-07-30 DIAGNOSIS — H539 Unspecified visual disturbance: Secondary | ICD-10-CM | POA: Diagnosis not present

## 2021-07-30 NOTE — Progress Notes (Signed)
Subjective:  Patient ID: Elizabeth Bradley, female    DOB: 12-23-1929  Age: 87 y.o. MRN: [id number]  Chief Complaint  Patient presents with   Hyperlipidemia   Hypothyroidism   Hypertension    HPI Patient is a 87 year old white female who presents for follow-up of hyperlipidemia, hypothyroidism, hypertension, SVT, and vision loss secondary to stroke years ago.  For hyperlipidemia she takes Crestor 5 mg 3 times a week.  Patient tries to eat healthy. Hypothyroidism: Currently on Synthroid 25 mcg once daily. Hypertension: Currently on amlodipine 2.5 mg once daily.  Has a history of strokes several years ago.  Currently on Plavix 75 mg once daily.  Initially she had a stroke in her left eye and lost vision and then approximately 6 months later she had a stroke in her right eye and lost vision.  She has significantly limited vision bilaterally and is legally blind.  She is living independent living and does very well. GERD: On Pepcid 20 mg once a day. SVT currently will take diltiazem 120 mg once daily if she has onset of palpitations which she is very aware when this occurs. Her only other complaint is of persistent dry cough which we have worked up in the past unsuccessfully.  We have been unable to find out what is causing this.  The patient drinks a small amount of [place] honey whiskey nightly to help with the cough so she can sleep.  Current Outpatient Medications on File Prior to Visit  Medication Sig Dispense Refill   amLODipine (NORVASC) 2.5 MG tablet TAKE 1 TABLET BY MOUTH ONCE DAILY. 90 tablet 1   cetirizine (ZYRTEC) 10 MG chewable tablet Chew 10 mg by mouth daily.     Cholecalciferol (VITAMIN D-3) 25 MCG (1000 UT) CAPS Take by mouth.     clopidogrel (PLAVIX) 75 MG tablet Take 1 tablet (75 mg total) by mouth daily. 90 tablet 3   diltiazem (TIAZAC) 120 MG 24 hr capsule Take 1 capsule by mouth daily.     famotidine (PEPCID) 20 MG tablet TAKE 1 TABLET BY MOUTH ONCE DAILY 90 tablet 3    fluticasone (FLONASE) 50 MCG/ACT nasal spray Place 2 sprays into both nostrils daily. 16 g 6   levothyroxine (SYNTHROID) 25 MCG tablet TAKE 1 TABLET BY MOUTH ONCE DAILY. 90 tablet 1   rosuvastatin (CRESTOR) 5 MG tablet Take 1 tablet (5 mg total) by mouth 3 (three) times a week. 1 tablet 0   Current Facility-Administered Medications on File Prior to Visit  Medication Dose Route Frequency Provider Last Rate Last Admin   triamcinolone acetonide (KENALOG-40) injection 40 mg  40 mg Intra-articular Once Taffy Delconte, Elnita Maxwell, MD       Past Medical History:  Diagnosis Date   Atrophy of thyroid    Cerebrovascular accident (CVA) ([place])    Essential hypertension    GERD (gastroesophageal reflux disease)    History of skin cancer    Ischemic optic neuropathy of left eye    Low back pain    Mixed hyperlipidemia    Osteoarthritis    Stroke ([place])    Vitamin D deficiency    Past Surgical History:  Procedure Laterality Date   ABDOMINAL HYSTERECTOMY     SALPINGOOPHORECTOMY     THYROIDECTOMY      Family History  Problem Relation Age of Onset   Hypertension Mother    Heart failure Mother    Heart failure Father    Cancer Sister  LUNG   Heart failure Brother    Hypertension Brother    Cancer Brother    Social History   Socioeconomic History   Marital status: Widowed    Spouse name: Not on file   Number of children: Not on file   Years of education: Not on file   Highest education level: Not on file  Occupational History   Not on file  Tobacco Use   Smoking status: Never   Smokeless tobacco: Never  Substance and Sexual Activity   Alcohol use: Never   Drug use: Never   Sexual activity: Not Currently  Other Topics Concern   Not on file  Social History Narrative   Not on file   Social Determinants of Health   Financial Resource Strain: Not on file  Food Insecurity: Not on file  Transportation Needs: Not on file  Physical Activity: Not on file  Stress: Not on file  Social  Connections: Not on file    Review of Systems  Constitutional:  Negative for chills, fatigue and fever.  HENT:  Positive for ear pain (knot behind right ear). Negative for congestion, rhinorrhea and sore throat.   Respiratory:  Positive for cough (chronic). Negative for shortness of breath.   Cardiovascular:  Positive for palpitations. Negative for chest pain.  Gastrointestinal:  Negative for abdominal pain, constipation, diarrhea, nausea and vomiting.  Genitourinary:  Negative for dysuria and urgency.  Musculoskeletal:  Negative for arthralgias, back pain and myalgias.  Neurological:  Positive for weakness. Negative for dizziness, light-headedness and headaches.  Psychiatric/Behavioral:  Negative for dysphoric mood. The patient is not nervous/anxious.     Objective:  BP 138/68   Pulse 84   Temp (!) 96.3 F (35.7 C)   Resp 14   Wt 126 lb (57.2 kg)   BMI 25.45 kg/m   BP/Weight 07/30/2021 06/30/2021 9/56/2130  Systolic BP 865 784 696  Diastolic BP 68 60 64  Wt. (Lbs) 126 132 128  BMI 25.45 26.66 25.85    Physical Exam Vitals reviewed.  Constitutional:      Appearance: Normal appearance. She is normal weight.  HENT:     Ears:     Comments: Posterior right ear: Skin is normal.  No obvious abnormality.  Patient is concerned as to make an appointment with her dermatologist Neck:     Vascular: No carotid bruit.  Cardiovascular:     Rate and Rhythm: Normal rate and regular rhythm.     Heart sounds: Normal heart sounds.  Pulmonary:     Effort: Pulmonary effort is normal. No respiratory distress.     Breath sounds: Normal breath sounds.  Abdominal:     General: Abdomen is flat. Bowel sounds are normal.     Palpations: Abdomen is soft.     Tenderness: There is no abdominal tenderness.  Neurological:     Mental Status: She is alert and oriented to person, place, and time.  Psychiatric:        Mood and Affect: Mood normal.        Behavior: Behavior normal.    Diabetic  Foot Exam - Simple   No data filed      Lab Results  Component Value Date   WBC 5.2 07/30/2021   HGB 13.0 07/30/2021   HCT 38.0 07/30/2021   PLT 222 07/30/2021   GLUCOSE 91 07/30/2021   CHOL 221 (H) 07/30/2021   TRIG 84 07/30/2021   HDL 94 07/30/2021   LDLCALC 113 (H) 07/30/2021  ALT 12 07/30/2021   AST 16 07/30/2021   NA 143 07/30/2021   K 4.2 07/30/2021   CL 105 07/30/2021   CREATININE 1.39 (H) 07/30/2021   BUN 22 07/30/2021   CO2 23 07/30/2021   TSH 2.980 01/29/2021      Assessment & Plan:   Problem List Items Addressed This Visit       Cardiovascular and Mediastinum   Essential hypertension    Continue amlodipine and diltiazem.      PSVT (paroxysmal supraventricular tachycardia) ([facility])    Continue diltiazem 120 mg once daily.       Atherosclerosis of aorta ([facility])    Continue crestor 5 mg daily and plavix 75 mg once daily.        Endocrine   Secondary hypothyroidism    The current medical regimen is effective;  continue present plan and medications.         Other   Mixed hyperlipidemia - Primary    The current medical regimen is effective;  continue present plan and medications. Continue rosuvastatin 5 mg once daily.        Relevant Orders   CBC with Differential/Platelet (Completed)   Comprehensive metabolic panel (Completed)   Lipid panel (Completed)   VITAMIN D 25 Hydroxy (Vit-D Deficiency, Fractures) (Completed)   Vision disturbance following CVA (cerebrovascular accident)    Continue plavix and rosuvastatin.      Other Visit Diagnoses     Need for influenza vaccination       Relevant Orders   Flu Vaccine QUAD High Dose(Fluad) (Completed)     .  No orders of the defined types were placed in this encounter.   Orders Placed This Encounter  Procedures   Flu Vaccine QUAD High Dose(Fluad)   CBC with Differential/Platelet   Comprehensive metabolic panel   Lipid panel   VITAMIN D 25 Hydroxy (Vit-D Deficiency, Fractures)    Cardiovascular Risk Assessment     Follow-up: Return in about 6 months (around 01/27/2022) for chronic fasting.  An After Visit Summary was printed and given to the patient.  Rochel Brome, MD [facility] [phone]

## 2021-07-31 LAB — LIPID PANEL
Chol/HDL Ratio: 2.4 ratio (ref 0.0–4.4)
Cholesterol, Total: 221 mg/dL — ABNORMAL HIGH (ref 100–199)
HDL: 94 mg/dL (ref >39)
LDL Chol Calc (NIH): 113 mg/dL — ABNORMAL HIGH (ref 0–99)
Triglycerides: 84 mg/dL (ref 0–149)
VLDL Cholesterol Cal: 14 mg/dL (ref 5–40)

## 2021-07-31 LAB — VITAMIN D 25 HYDROXY (VIT D DEFICIENCY, FRACTURES): Vit D, 25-Hydroxy: 42.7 ng/mL (ref 30.0–100.0)

## 2021-07-31 LAB — CBC WITH DIFFERENTIAL/PLATELET
Basophils Absolute: 0 10*3/uL (ref 0.0–0.2)
Basos: 0 %
EOS (ABSOLUTE): 0.2 10*3/uL (ref 0.0–0.4)
Eos: 4 %
Hematocrit: 38 % (ref 34.0–46.6)
Hemoglobin: 13 g/dL (ref 11.1–15.9)
Immature Grans (Abs): 0 10*3/uL (ref 0.0–0.1)
Immature Granulocytes: 0 %
Lymphocytes Absolute: 1.5 10*3/uL (ref 0.7–3.1)
Lymphs: 29 %
MCH: 30.2 pg (ref 26.6–33.0)
MCHC: 34.2 g/dL (ref 31.5–35.7)
MCV: 88 fL (ref 79–97)
Monocytes Absolute: 0.7 10*3/uL (ref 0.1–0.9)
Monocytes: 13 %
Neutrophils Absolute: 2.8 10*3/uL (ref 1.4–7.0)
Neutrophils: 54 %
Platelets: 222 10*3/uL (ref 150–450)
RBC: 4.3 x10E6/uL (ref 3.77–5.28)
RDW: 12.4 % (ref 11.7–15.4)
WBC: 5.2 10*3/uL (ref 3.4–10.8)

## 2021-07-31 LAB — COMPREHENSIVE METABOLIC PANEL
ALT: 12 IU/L (ref 0–32)
AST: 16 IU/L (ref 0–40)
Albumin/Globulin Ratio: 2 (ref 1.2–2.2)
Albumin: 4.5 g/dL (ref 3.6–4.6)
Alkaline Phosphatase: 104 IU/L (ref 44–121)
BUN/Creatinine Ratio: 16 (ref 12–28)
BUN: 22 mg/dL (ref 8–27)
Bilirubin Total: 0.6 mg/dL (ref 0.0–1.2)
CO2: 23 mmol/L (ref 20–29)
Calcium: 10.2 mg/dL (ref 8.7–10.3)
Chloride: 105 mmol/L (ref 96–106)
Creatinine, Ser: 1.39 mg/dL — ABNORMAL HIGH (ref 0.57–1.00)
Globulin, Total: 2.2 g/dL (ref 1.5–4.5)
Glucose: 91 mg/dL (ref 70–99)
Potassium: 4.2 mmol/L (ref 3.5–5.2)
Sodium: 143 mmol/L (ref 134–144)
Total Protein: 6.7 g/dL (ref 6.0–8.5)
eGFR: 36 mL/min/{1.73_m2} — ABNORMAL LOW (ref >59)

## 2021-08-18 ENCOUNTER — Encounter: Payer: Self-pay | Admitting: Family Medicine

## 2021-08-18 NOTE — Assessment & Plan Note (Signed)
Continue amlodipine and diltiazem.

## 2021-08-18 NOTE — Assessment & Plan Note (Signed)
Continue crestor 5 mg daily and plavix 75 mg once daily.

## 2021-08-18 NOTE — Assessment & Plan Note (Signed)
Continue diltiazem 120 mg once daily.

## 2021-08-18 NOTE — Assessment & Plan Note (Signed)
Continue plavix and rosuvastatin.

## 2021-08-18 NOTE — Assessment & Plan Note (Signed)
The current medical regimen is effective;  continue present plan and medications.  

## 2021-08-18 NOTE — Assessment & Plan Note (Signed)
The current medical regimen is effective;  continue present plan and medications. Continue rosuvastatin 5 mg once daily.

## 2021-09-15 DIAGNOSIS — L821 Other seborrheic keratosis: Secondary | ICD-10-CM | POA: Diagnosis not present

## 2021-09-15 DIAGNOSIS — L570 Actinic keratosis: Secondary | ICD-10-CM | POA: Diagnosis not present

## 2021-09-15 DIAGNOSIS — D0439 Carcinoma in situ of skin of other parts of face: Secondary | ICD-10-CM | POA: Diagnosis not present

## 2021-10-07 ENCOUNTER — Other Ambulatory Visit: Payer: Self-pay | Admitting: Family Medicine

## 2021-10-07 DIAGNOSIS — I10 Essential (primary) hypertension: Secondary | ICD-10-CM

## 2021-10-07 DIAGNOSIS — M1909 Primary osteoarthritis, other specified site: Secondary | ICD-10-CM

## 2021-10-07 NOTE — Telephone Encounter (Signed)
Refill sent to pharmacy.for amlodipine.   Refused celebrex because patient is no longer taking.

## 2021-10-15 ENCOUNTER — Other Ambulatory Visit: Payer: Self-pay | Admitting: Family Medicine

## 2021-10-15 DIAGNOSIS — E782 Mixed hyperlipidemia: Secondary | ICD-10-CM

## 2021-10-16 DIAGNOSIS — H00014 Hordeolum externum left upper eyelid: Secondary | ICD-10-CM | POA: Diagnosis not present

## 2021-10-27 DIAGNOSIS — C441191 Basal cell carcinoma of skin of left upper eyelid, including canthus: Secondary | ICD-10-CM | POA: Diagnosis not present

## 2021-11-27 ENCOUNTER — Telehealth: Payer: Self-pay

## 2021-11-27 ENCOUNTER — Other Ambulatory Visit: Payer: Self-pay | Admitting: Legal Medicine

## 2021-11-27 DIAGNOSIS — M5136 Other intervertebral disc degeneration, lumbar region: Secondary | ICD-10-CM

## 2021-11-27 MED ORDER — CELECOXIB 100 MG PO CAPS: 100.0000 mg | ORAL_CAPSULE | Freq: Two times a day (BID) | ORAL | 2 refills | Status: DC

## 2021-11-27 NOTE — Telephone Encounter (Signed)
Patient calling requesting 10 capsules of celebrex. She does not take this often and does not request more than 10 caps due to expense of medication.  ? ?It is documented in Feb of 2022 there was a change in therapy. Please advise.  ? ?Was previously taking celebrex 100 mg capsules 1 as needed.  ? ?Elizabeth Bradley 11/27/21 11:21 AM ? ?

## 2021-11-27 NOTE — Telephone Encounter (Signed)
Patient was informed.

## 2021-11-27 NOTE — Telephone Encounter (Signed)
I called in celebrex ?lp ?

## 2021-12-16 ENCOUNTER — Other Ambulatory Visit: Payer: Self-pay | Admitting: Family Medicine

## 2021-12-16 NOTE — Telephone Encounter (Signed)
Refill sent to pharmacy.   

## 2022-01-12 ENCOUNTER — Other Ambulatory Visit: Payer: Self-pay | Admitting: Legal Medicine

## 2022-01-12 DIAGNOSIS — M5136 Other intervertebral disc degeneration, lumbar region: Secondary | ICD-10-CM

## 2022-01-22 DIAGNOSIS — R9431 Abnormal electrocardiogram [ECG] [EKG]: Secondary | ICD-10-CM | POA: Diagnosis not present

## 2022-01-22 DIAGNOSIS — E782 Mixed hyperlipidemia: Secondary | ICD-10-CM | POA: Diagnosis not present

## 2022-01-22 DIAGNOSIS — I471 Supraventricular tachycardia: Secondary | ICD-10-CM | POA: Diagnosis not present

## 2022-01-22 DIAGNOSIS — I10 Essential (primary) hypertension: Secondary | ICD-10-CM | POA: Diagnosis not present

## 2022-01-28 ENCOUNTER — Ambulatory Visit: Payer: PPO | Admitting: Family Medicine

## 2022-02-17 ENCOUNTER — Telehealth: Payer: Self-pay

## 2022-02-17 NOTE — Telephone Encounter (Signed)
I left a message on the number(s) listed in the patients chart requesting the patient to call back regarding the [facility] appointment for 02/24/2022. The provider is out of the office that day. The appointment has been canceled. Waiting for the patient to return the call.

## 2022-02-24 ENCOUNTER — Ambulatory Visit: Payer: PPO | Admitting: Family Medicine

## 2022-03-16 DIAGNOSIS — L578 Other skin changes due to chronic exposure to nonionizing radiation: Secondary | ICD-10-CM | POA: Diagnosis not present

## 2022-03-16 DIAGNOSIS — C44319 Basal cell carcinoma of skin of other parts of face: Secondary | ICD-10-CM | POA: Diagnosis not present

## 2022-03-16 DIAGNOSIS — L821 Other seborrheic keratosis: Secondary | ICD-10-CM | POA: Diagnosis not present

## 2022-03-16 DIAGNOSIS — L570 Actinic keratosis: Secondary | ICD-10-CM | POA: Diagnosis not present

## 2022-03-17 ENCOUNTER — Encounter: Payer: Self-pay | Admitting: Family Medicine

## 2022-03-17 ENCOUNTER — Ambulatory Visit (INDEPENDENT_AMBULATORY_CARE_PROVIDER_SITE_OTHER): Payer: PPO | Admitting: Family Medicine

## 2022-03-17 VITALS — BP 110/64 | HR 80 | Temp 97.1°F | Resp 16 | Ht 59.0 in | Wt 125.0 lb

## 2022-03-17 DIAGNOSIS — H539 Unspecified visual disturbance: Secondary | ICD-10-CM | POA: Diagnosis not present

## 2022-03-17 DIAGNOSIS — I700 Atherosclerosis of aorta: Secondary | ICD-10-CM

## 2022-03-17 DIAGNOSIS — I471 Supraventricular tachycardia: Secondary | ICD-10-CM | POA: Diagnosis not present

## 2022-03-17 DIAGNOSIS — R1314 Dysphagia, pharyngoesophageal phase: Secondary | ICD-10-CM

## 2022-03-17 DIAGNOSIS — E038 Other specified hypothyroidism: Secondary | ICD-10-CM | POA: Diagnosis not present

## 2022-03-17 DIAGNOSIS — M5136 Other intervertebral disc degeneration, lumbar region: Secondary | ICD-10-CM

## 2022-03-17 DIAGNOSIS — K219 Gastro-esophageal reflux disease without esophagitis: Secondary | ICD-10-CM | POA: Diagnosis not present

## 2022-03-17 DIAGNOSIS — I10 Essential (primary) hypertension: Secondary | ICD-10-CM

## 2022-03-17 DIAGNOSIS — I69398 Other sequelae of cerebral infarction: Secondary | ICD-10-CM

## 2022-03-17 DIAGNOSIS — E782 Mixed hyperlipidemia: Secondary | ICD-10-CM | POA: Diagnosis not present

## 2022-03-17 MED ORDER — CLOPIDOGREL BISULFATE 75 MG PO TABS: 75.0000 mg | ORAL_TABLET | Freq: Every day | ORAL | 1 refills | Status: DC

## 2022-03-17 MED ORDER — DILTIAZEM HCL ER BEADS 120 MG PO CP24: 120.0000 mg | ORAL_CAPSULE | Freq: Every day | ORAL | 1 refills | Status: DC

## 2022-03-17 MED ORDER — FAMOTIDINE 20 MG PO TABS
20.0000 mg | ORAL_TABLET | Freq: Every day | ORAL | 1 refills | Status: DC
Start: 2022-03-17 — End: 2023-01-16

## 2022-03-17 MED ORDER — CELECOXIB 100 MG PO CAPS: 100.0000 mg | ORAL_CAPSULE | Freq: Every day | ORAL | 1 refills | Status: DC

## 2022-03-17 MED ORDER — ROSUVASTATIN CALCIUM 5 MG PO TABS: 5.0000 mg | ORAL_TABLET | Freq: Every day | ORAL | 0 refills | Status: DC

## 2022-03-17 NOTE — Progress Notes (Unsigned)
Subjective:  Patient ID: Elizabeth Bradley, female    DOB: August 07, 1930  Age: 88 y.o. MRN: [id number]  Chief complaint: chronic follow up of hyperlipidemia.  HPI Patient is a 88 year old white female who presents for follow-up of hyperlipidemia, hypothyroidism, hypertension, SVT, and vision loss secondary to stroke years ago.  For hyperlipidemia she takes Crestor 5 mg 3-4 times a week. Eats healthy. Hypothyroidism: Currently on Synthroid 25 mcg once daily. Hypertension: Currently on amlodipine 2.5 mg once daily.  Has a history of strokes several years ago.  Currently on Plavix 75 mg once daily.  Initially she had a stroke in her left eye and lost vision and then approximately 6 months later she had a stroke in her right eye and lost vision.  She has significantly limited vision bilaterally and is legally blind.  She is living independent living and does very well. GERD: On Pepcid 20 mg once a day as needed. Takes it about 3-4 times per week. Marland Kitchen SVT currently will take diltiazem 120 mg once daily. Shoulder pain left: takes celebrex 200 mg daily as needed.  Dermatology: has had skin biopsy yesterday..   Current Outpatient Medications on File Prior to Visit  Medication Sig Dispense Refill   amLODipine (NORVASC) 2.5 MG tablet TAKE 1 TABLET BY MOUTH ONCE DAILY. 90 tablet 1   cetirizine (ZYRTEC) 10 MG chewable tablet Chew 10 mg by mouth daily.     Cholecalciferol (VITAMIN D-3) 25 MCG (1000 UT) CAPS Take by mouth.     fluticasone (FLONASE) 50 MCG/ACT nasal spray Place 2 sprays into both nostrils daily. 16 g 6   levothyroxine (SYNTHROID) 25 MCG tablet TAKE 1 TABLET BY MOUTH ONCE DAILY. 90 tablet 1   Current Facility-Administered Medications on File Prior to Visit  Medication Dose Route Frequency Provider Last Rate Last Admin   triamcinolone acetonide (KENALOG-40) injection 40 mg  40 mg Intra-articular Once Karolyn Messing, Elnita Maxwell, MD       Past Medical History:  Diagnosis Date   Atrophy of thyroid     Cerebrovascular accident (CVA) ([place])    Essential hypertension    GERD (gastroesophageal reflux disease)    History of skin cancer    Ischemic optic neuropathy of left eye    Low back pain    Mixed hyperlipidemia    Osteoarthritis    Stroke ([place])    Vitamin D deficiency    Past Surgical History:  Procedure Laterality Date   ABDOMINAL HYSTERECTOMY     SALPINGOOPHORECTOMY     THYROIDECTOMY      Family History  Problem Relation Age of Onset   Hypertension Mother    Heart failure Mother    Heart failure Father    Cancer Sister        LUNG   Heart failure Brother    Hypertension Brother    Cancer Brother    Social History   Socioeconomic History   Marital status: Widowed    Spouse name: Not on file   Number of children: Not on file   Years of education: Not on file   Highest education level: Not on file  Occupational History   Not on file  Tobacco Use   Smoking status: Never   Smokeless tobacco: Never  Substance and Sexual Activity   Alcohol use: Never   Drug use: Never   Sexual activity: Not Currently  Other Topics Concern   Not on file  Social History Narrative   Not on file   Social Determinants of  Health   Financial Resource Strain: Not on file  Food Insecurity: Not on file  Transportation Needs: Not on file  Physical Activity: Not on file  Stress: Not on file  Social Connections: Not on file    Review of Systems  Constitutional:  Negative for chills, fatigue and fever.  HENT:  Negative for congestion, rhinorrhea and sore throat.   Respiratory:  Negative for cough and shortness of breath.   Cardiovascular:  Positive for leg swelling (left leg and ankle). Negative for chest pain.  Gastrointestinal:  Negative for abdominal pain, constipation, diarrhea, nausea and vomiting.  Endocrine: Negative for polydipsia and polyphagia.  Genitourinary:  Negative for dysuria and urgency.  Musculoskeletal:  Negative for arthralgias, back pain and myalgias.   Neurological:  Positive for weakness. Negative for dizziness, light-headedness and headaches.  Psychiatric/Behavioral:  Negative for dysphoric mood. The patient is not nervous/anxious.      Objective:  BP 110/64   Pulse 80   Temp (!) 97.1 F (36.2 C)   Resp 16   Ht '4\' 11"'$  (1.499 m)   Wt 125 lb (56.7 kg)   BMI 25.25 kg/m      03/17/2022    2:07 PM 03/17/2022    1:34 PM 07/30/2021   10:01 AM  BP/Weight  Systolic BP 614 431 540  Diastolic BP 64 64 68  Wt. (Lbs)  125 126  BMI  25.25 kg/m2 25.45 kg/m2    Physical Exam Vitals reviewed.  Constitutional:      Appearance: Normal appearance. She is normal weight.  Neck:     Vascular: No carotid bruit.  Cardiovascular:     Rate and Rhythm: Normal rate and regular rhythm.     Heart sounds: Normal heart sounds.  Pulmonary:     Effort: Pulmonary effort is normal. No respiratory distress.     Breath sounds: Normal breath sounds.  Abdominal:     General: Abdomen is flat. Bowel sounds are normal.     Palpations: Abdomen is soft.     Tenderness: There is no abdominal tenderness.  Neurological:     Mental Status: She is alert and oriented to person, place, and time.  Psychiatric:        Mood and Affect: Mood normal.        Behavior: Behavior normal.     Diabetic Foot Exam - Simple   No data filed      Lab Results  Component Value Date   WBC 5.5 03/17/2022   HGB 12.6 03/17/2022   HCT 37.7 03/17/2022   PLT 217 03/17/2022   GLUCOSE 86 03/17/2022   CHOL 201 (H) 03/17/2022   TRIG 79 03/17/2022   HDL 102 03/17/2022   LDLCALC 85 03/17/2022   ALT 11 03/17/2022   AST 20 03/17/2022   NA 144 03/17/2022   K 4.4 03/17/2022   CL 106 03/17/2022   CREATININE 1.16 (H) 03/17/2022   BUN 24 03/17/2022   CO2 22 03/17/2022   TSH 2.320 03/17/2022      Assessment & Plan:   Problem List Items Addressed This Visit       Cardiovascular and Mediastinum   Essential hypertension    The current medical regimen is effective;   continue present plan and medications. Continue amlodipine 2.5 mg qd.       Relevant Medications   rosuvastatin (CRESTOR) 5 MG tablet   diltiazem (TIAZAC) 120 MG 24 hr capsule   Other Relevant Orders   CBC with Differential/Platelet (Completed)  Comprehensive metabolic panel (Completed)   PSVT (paroxysmal supraventricular tachycardia) (HCC)    The current medical regimen is effective;  continue present plan and medications. Continue diltiazem 120 mg daily.       Relevant Medications   rosuvastatin (CRESTOR) 5 MG tablet   diltiazem (TIAZAC) 120 MG 24 hr capsule   Atherosclerosis of aorta (HCC)    Continue crestor and plavix.       Relevant Medications   rosuvastatin (CRESTOR) 5 MG tablet   diltiazem (TIAZAC) 120 MG 24 hr capsule     Digestive   Gastroesophageal reflux disease without esophagitis    The current medical regimen is effective;  continue present plan and medications.       Relevant Medications   famotidine (PEPCID) 20 MG tablet     Endocrine   Secondary hypothyroidism    Continue synthroid 25 mcg once daily in am.       Relevant Orders   TSH (Completed)     Musculoskeletal and Integument   Degeneration of lumbar intervertebral disc   Relevant Medications   celecoxib (CELEBREX) 100 MG capsule     Other   Mixed hyperlipidemia    The current medical regimen is effective;  continue present plan and medications. Continue crestor 5 mg 3-4 times per week.       Relevant Medications   rosuvastatin (CRESTOR) 5 MG tablet   diltiazem (TIAZAC) 120 MG 24 hr capsule   Other Relevant Orders   Lipid panel (Completed)   Vision disturbance following CVA (cerebrovascular accident)    Continue plavix 75 mg daily. Continue crestor      Other Visit Diagnoses     Pharyngoesophageal dysphagia    -  Primary   Relevant Orders   Ambulatory referral to Gastroenterology     .  Meds ordered this encounter  Medications   famotidine (PEPCID) 20 MG tablet     Sig: Take 1 tablet (20 mg total) by mouth daily.    Dispense:  90 tablet    Refill:  1   rosuvastatin (CRESTOR) 5 MG tablet    Sig: Take 1 tablet (5 mg total) by mouth daily.    Dispense:  90 tablet    Refill:  0   diltiazem (TIAZAC) 120 MG 24 hr capsule    Sig: Take 1 capsule (120 mg total) by mouth daily.    Dispense:  90 capsule    Refill:  1   celecoxib (CELEBREX) 100 MG capsule    Sig: Take 1 capsule (100 mg total) by mouth daily at 6 (six) AM.    Dispense:  90 capsule    Refill:  1   clopidogrel (PLAVIX) 75 MG tablet    Sig: Take 1 tablet (75 mg total) by mouth daily.    Dispense:  90 tablet    Refill:  1    Orders Placed This Encounter  Procedures   CBC with Differential/Platelet   Lipid panel   Comprehensive metabolic panel   TSH   Cardiovascular Risk Assessment   Ambulatory referral to Gastroenterology    Total time spent on today's visit was greater than 30 minutes, including both face-to-face time and nonface-to-face time personally spent on review of chart (labs and imaging), discussing labs and goals, discussing further work-up, treatment options, referrals to specialist if needed, reviewing outside records of pertinent, answering patient's questions, and coordinating care.  Follow-up: Return in about 6 months (around 09/17/2022) for chronic fasting, awv in early September with Kim. Marland Kitchen  An After Visit Summary was printed and given to the patient.  Rochel Brome, MD [facility] [phone]

## 2022-03-18 LAB — COMPREHENSIVE METABOLIC PANEL
ALT: 11 IU/L (ref 0–32)
AST: 20 IU/L (ref 0–40)
Albumin/Globulin Ratio: 1.8 (ref 1.2–2.2)
Albumin: 4.5 g/dL (ref 3.7–4.7)
Alkaline Phosphatase: 86 IU/L (ref 44–121)
BUN/Creatinine Ratio: 21 (ref 12–28)
BUN: 24 mg/dL (ref 8–27)
Bilirubin Total: 0.6 mg/dL (ref 0.0–1.2)
CO2: 22 mmol/L (ref 20–29)
Calcium: 10.1 mg/dL (ref 8.7–10.3)
Chloride: 106 mmol/L (ref 96–106)
Creatinine, Ser: 1.16 mg/dL — ABNORMAL HIGH (ref 0.57–1.00)
Globulin, Total: 2.5 g/dL (ref 1.5–4.5)
Glucose: 86 mg/dL (ref 70–99)
Potassium: 4.4 mmol/L (ref 3.5–5.2)
Sodium: 144 mmol/L (ref 134–144)
Total Protein: 7 g/dL (ref 6.0–8.5)
eGFR: 45 mL/min/{1.73_m2} — ABNORMAL LOW (ref >59)

## 2022-03-18 LAB — CBC WITH DIFFERENTIAL/PLATELET
Basophils Absolute: 0.1 10*3/uL (ref 0.0–0.2)
Basos: 1 %
EOS (ABSOLUTE): 0.4 10*3/uL (ref 0.0–0.4)
Eos: 7 %
Hematocrit: 37.7 % (ref 34.0–46.6)
Hemoglobin: 12.6 g/dL (ref 11.1–15.9)
Immature Grans (Abs): 0 10*3/uL (ref 0.0–0.1)
Immature Granulocytes: 0 %
Lymphocytes Absolute: 1.9 10*3/uL (ref 0.7–3.1)
Lymphs: 34 %
MCH: 29.7 pg (ref 26.6–33.0)
MCHC: 33.4 g/dL (ref 31.5–35.7)
MCV: 89 fL (ref 79–97)
Monocytes Absolute: 0.6 10*3/uL (ref 0.1–0.9)
Monocytes: 11 %
Neutrophils Absolute: 2.6 10*3/uL (ref 1.4–7.0)
Neutrophils: 47 %
Platelets: 217 10*3/uL (ref 150–450)
RBC: 4.24 x10E6/uL (ref 3.77–5.28)
RDW: 12.1 % (ref 11.7–15.4)
WBC: 5.5 10*3/uL (ref 3.4–10.8)

## 2022-03-18 LAB — TSH: TSH: 2.32 u[IU]/mL (ref 0.450–4.500)

## 2022-03-18 LAB — LIPID PANEL
Chol/HDL Ratio: 2 ratio (ref 0.0–4.4)
Cholesterol, Total: 201 mg/dL — ABNORMAL HIGH (ref 100–199)
HDL: 102 mg/dL (ref >39)
LDL Chol Calc (NIH): 85 mg/dL (ref 0–99)
Triglycerides: 79 mg/dL (ref 0–149)
VLDL Cholesterol Cal: 14 mg/dL (ref 5–40)

## 2022-03-19 NOTE — Assessment & Plan Note (Signed)
The current medical regimen is effective;  continue present plan and medications. Continue diltiazem 120 mg daily.

## 2022-03-19 NOTE — Assessment & Plan Note (Signed)
The current medical regimen is effective;  continue present plan and medications. Continue crestor 5 mg 3-4 times per week.

## 2022-03-19 NOTE — Assessment & Plan Note (Signed)
Continue crestor and plavix.  

## 2022-03-19 NOTE — Assessment & Plan Note (Signed)
The current medical regimen is effective;  continue present plan and medications.  

## 2022-03-19 NOTE — Assessment & Plan Note (Addendum)
Continue plavix 75 mg daily. Continue crestor

## 2022-03-19 NOTE — Assessment & Plan Note (Signed)
Continue synthroid 25 mcg once daily in am.

## 2022-03-19 NOTE — Assessment & Plan Note (Signed)
The current medical regimen is effective;  continue present plan and medications. Continue amlodipine 2.5 mg qd.

## 2022-04-09 DIAGNOSIS — R059 Cough, unspecified: Secondary | ICD-10-CM | POA: Diagnosis not present

## 2022-04-09 DIAGNOSIS — K219 Gastro-esophageal reflux disease without esophagitis: Secondary | ICD-10-CM | POA: Diagnosis not present

## 2022-04-09 DIAGNOSIS — R1310 Dysphagia, unspecified: Secondary | ICD-10-CM | POA: Diagnosis not present

## 2022-05-08 ENCOUNTER — Other Ambulatory Visit: Payer: Self-pay

## 2022-05-08 DIAGNOSIS — I10 Essential (primary) hypertension: Secondary | ICD-10-CM

## 2022-05-08 MED ORDER — AMLODIPINE BESYLATE 2.5 MG PO TABS: 2.5000 mg | ORAL_TABLET | Freq: Every day | ORAL | 1 refills | Status: DC

## 2022-05-18 ENCOUNTER — Telehealth: Payer: Self-pay

## 2022-05-18 NOTE — Telephone Encounter (Signed)
Patient informed, patient would like to schedule an appointment. Scheduled appointment for patient.

## 2022-05-18 NOTE — Telephone Encounter (Signed)
Patient called stating that her left foot has been swelling during the day and goes down at night time. Patient states that she was wanting to know if you thought if she needed to try a fluid pill to see if it would help her because she stated that she doesn't know if she does drink as much fluids as she should but would like to know if you want her to try the fluid pill for her swelling. Please advise.

## 2022-05-21 ENCOUNTER — Ambulatory Visit (INDEPENDENT_AMBULATORY_CARE_PROVIDER_SITE_OTHER): Payer: PPO | Admitting: Family Medicine

## 2022-05-21 ENCOUNTER — Encounter: Payer: Self-pay | Admitting: Family Medicine

## 2022-05-21 VITALS — BP 144/60 | HR 70 | Temp 96.6°F | Resp 15 | Ht 59.0 in | Wt 127.0 lb

## 2022-05-21 DIAGNOSIS — R600 Localized edema: Secondary | ICD-10-CM | POA: Diagnosis not present

## 2022-05-21 NOTE — Patient Instructions (Addendum)
Hold diltiazem.  Monitor swelling.  Elevate feet, avoid salt. May use compression socks.

## 2022-05-21 NOTE — Progress Notes (Signed)
Acute Office Visit  Subjective:    Patient ID: Elizabeth Bradley, female    DOB: August 21, 1930, 88 y.o.   MRN: [id number]  Chief Complaint  Patient presents with   Left leg/foot swelling    HPI Patient is in today for left ankle/foot swelling come and goes, but lately has been swelling all the time. She denies pain, numbness. She put her feet up, but the swelling is not going down too much.  Past Medical History:  Diagnosis Date   Atrophy of thyroid    Cerebrovascular accident (CVA) ([place])    Essential hypertension    GERD (gastroesophageal reflux disease)    History of skin cancer    Ischemic optic neuropathy of left eye    Low back pain    Mixed hyperlipidemia    Osteoarthritis    Stroke ([place])    Vitamin D deficiency     Past Surgical History:  Procedure Laterality Date   ABDOMINAL HYSTERECTOMY     SALPINGOOPHORECTOMY     THYROIDECTOMY      Family History  Problem Relation Age of Onset   Hypertension Mother    Heart failure Mother    Heart failure Father    Cancer Sister        LUNG   Heart failure Brother    Hypertension Brother    Cancer Brother     Social History   Socioeconomic History   Marital status: Widowed    Spouse name: Not on file   Number of children: Not on file   Years of education: Not on file   Highest education level: Not on file  Occupational History   Not on file  Tobacco Use   Smoking status: Never   Smokeless tobacco: Never  Substance and Sexual Activity   Alcohol use: Never   Drug use: Never   Sexual activity: Not Currently  Other Topics Concern   Not on file  Social History Narrative   Not on file   Social Determinants of Health   Financial Resource Strain: Not on file  Food Insecurity: Not on file  Transportation Needs: Not on file  Physical Activity: Not on file  Stress: Not on file  Social Connections: Not on file  Intimate Partner Violence: Not on file    Outpatient Medications Prior to Visit  Medication Sig  Dispense Refill   amLODipine (NORVASC) 2.5 MG tablet Take 1 tablet (2.5 mg total) by mouth daily. 90 tablet 1   celecoxib (CELEBREX) 100 MG capsule Take 1 capsule (100 mg total) by mouth daily at 6 (six) AM. 90 capsule 1   cetirizine (ZYRTEC) 10 MG chewable tablet Chew 10 mg by mouth daily.     Cholecalciferol (VITAMIN D-3) 25 MCG (1000 UT) CAPS Take by mouth.     clopidogrel (PLAVIX) 75 MG tablet Take 1 tablet (75 mg total) by mouth daily. 90 tablet 1   diltiazem (TIAZAC) 120 MG 24 hr capsule Take 1 capsule (120 mg total) by mouth daily. 90 capsule 1   famotidine (PEPCID) 20 MG tablet Take 1 tablet (20 mg total) by mouth daily. 90 tablet 1   fluticasone (FLONASE) 50 MCG/ACT nasal spray Place 2 sprays into both nostrils daily. 16 g 6   levothyroxine (SYNTHROID) 25 MCG tablet TAKE 1 TABLET BY MOUTH ONCE DAILY. 90 tablet 1   pantoprazole (PROTONIX) 40 MG tablet Take 40 mg by mouth every morning.     rosuvastatin (CRESTOR) 5 MG tablet Take 1 tablet (5 mg  total) by mouth daily. 90 tablet 0   pneumococcal 13-valent conjugate vaccine (PREVNAR 13) SUSP injection inject 0.5 milliliter intramuscularly     triamcinolone acetonide (KENALOG-40) injection 40 mg      No facility-administered medications prior to visit.    Allergies  Allergen Reactions   Simvastatin Other (See Comments)    Muscle Cramps   Adhesive [Tape]    Iodinated Contrast Media Hives and Other (See Comments)    Sneezing and hives 09/09/2019; needs 13 hr prep   Penicillins    Prednisone     Alopecia     Review of Systems  Constitutional:  Negative for appetite change, fatigue and fever.  HENT:  Negative for congestion, ear pain, sinus pressure and sore throat.   Respiratory:  Negative for cough, chest tightness, shortness of breath and wheezing.   Cardiovascular:  Positive for leg swelling (Left). Negative for chest pain and palpitations.  Gastrointestinal:  Negative for abdominal pain, constipation, diarrhea, nausea and  vomiting.  Genitourinary:  Negative for dysuria and hematuria.  Musculoskeletal:  Negative for arthralgias, back pain, joint swelling and myalgias.  Skin:  Negative for rash.  Neurological:  Negative for dizziness, weakness and headaches.  Psychiatric/Behavioral:  Negative for dysphoric mood. The patient is not nervous/anxious.        Objective:    Physical Exam Vitals reviewed.  Constitutional:      Appearance: Normal appearance. She is normal weight.  Cardiovascular:     Rate and Rhythm: Normal rate and regular rhythm.     Heart sounds: Normal heart sounds.  Pulmonary:     Effort: Pulmonary effort is normal.     Breath sounds: Normal breath sounds.  Musculoskeletal:     Right lower leg: Edema (trace) present.     Left lower leg: Edema (1 +) present.  Neurological:     Mental Status: She is alert and oriented to person, place, and time.  Psychiatric:        Mood and Affect: Mood normal.        Behavior: Behavior normal.     BP (!) 144/60   Pulse 70   Temp (!) 96.6 F (35.9 C)   Resp 15   Ht $R'4\' 11"'bs$  (1.499 m)   Wt 127 lb (57.6 kg)   SpO2 98%   BMI 25.65 kg/m  Wt Readings from Last 3 Encounters:  05/21/22 127 lb (57.6 kg)  03/17/22 125 lb (56.7 kg)  07/30/21 126 lb (57.2 kg)    Health Maintenance Due  Topic Date Due   TETANUS/TDAP  Never done   Zoster Vaccines- Shingrix (2 of 2) 07/21/2017   INFLUENZA VACCINE  04/07/2022   COVID-19 Vaccine (4 - Moderna series) 05/12/2022    There are no preventive care reminders to display for this patient.   Lab Results  Component Value Date   TSH 2.320 03/17/2022   Lab Results  Component Value Date   WBC 5.5 03/17/2022   HGB 12.6 03/17/2022   HCT 37.7 03/17/2022   MCV 89 03/17/2022   PLT 217 03/17/2022   Lab Results  Component Value Date   NA 144 03/17/2022   K 4.4 03/17/2022   CO2 22 03/17/2022   GLUCOSE 86 03/17/2022   BUN 24 03/17/2022   CREATININE 1.16 (H) 03/17/2022   BILITOT 0.6 03/17/2022    ALKPHOS 86 03/17/2022   AST 20 03/17/2022   ALT 11 03/17/2022   PROT 7.0 03/17/2022   ALBUMIN 4.5 03/17/2022   CALCIUM 10.1 03/17/2022  ANIONGAP 10 09/09/2019   EGFR 45 (L) 03/17/2022   Lab Results  Component Value Date   CHOL 201 (H) 03/17/2022   Lab Results  Component Value Date   HDL 102 03/17/2022   Lab Results  Component Value Date   LDLCALC 85 03/17/2022   Lab Results  Component Value Date   TRIG 79 03/17/2022   Lab Results  Component Value Date   CHOLHDL 2.0 03/17/2022   No results found for: "HGBA1C"       Assessment & Plan:   Problem List Items Addressed This Visit       Other   Pedal edema - Primary    Hold diltiazem.  Monitor swelling.  Elevate feet, avoid salt. May use compression socks.       Follow up: as needed   Rochel Brome, MD

## 2022-05-31 ENCOUNTER — Encounter: Payer: Self-pay | Admitting: Family Medicine

## 2022-05-31 NOTE — Assessment & Plan Note (Signed)
Hold diltiazem.  Monitor swelling.  Elevate feet, avoid salt. May use compression socks.

## 2022-06-30 ENCOUNTER — Other Ambulatory Visit: Payer: Self-pay

## 2022-06-30 MED ORDER — CLOPIDOGREL BISULFATE 75 MG PO TABS: 75.0000 mg | ORAL_TABLET | Freq: Every day | ORAL | 1 refills | Status: DC

## 2022-07-22 DIAGNOSIS — R55 Syncope and collapse: Secondary | ICD-10-CM | POA: Diagnosis not present

## 2022-07-22 DIAGNOSIS — I471 Supraventricular tachycardia, unspecified: Secondary | ICD-10-CM | POA: Diagnosis not present

## 2022-07-22 DIAGNOSIS — R0989 Other specified symptoms and signs involving the circulatory and respiratory systems: Secondary | ICD-10-CM | POA: Diagnosis not present

## 2022-07-22 DIAGNOSIS — I10 Essential (primary) hypertension: Secondary | ICD-10-CM | POA: Diagnosis not present

## 2022-07-22 DIAGNOSIS — R002 Palpitations: Secondary | ICD-10-CM | POA: Diagnosis not present

## 2022-07-22 DIAGNOSIS — E782 Mixed hyperlipidemia: Secondary | ICD-10-CM | POA: Diagnosis not present

## 2022-08-04 DIAGNOSIS — L728 Other follicular cysts of the skin and subcutaneous tissue: Secondary | ICD-10-CM | POA: Diagnosis not present

## 2022-08-04 DIAGNOSIS — L570 Actinic keratosis: Secondary | ICD-10-CM | POA: Diagnosis not present

## 2022-08-04 DIAGNOSIS — C44329 Squamous cell carcinoma of skin of other parts of face: Secondary | ICD-10-CM | POA: Diagnosis not present

## 2022-08-04 DIAGNOSIS — L814 Other melanin hyperpigmentation: Secondary | ICD-10-CM | POA: Diagnosis not present

## 2022-08-04 DIAGNOSIS — L578 Other skin changes due to chronic exposure to nonionizing radiation: Secondary | ICD-10-CM | POA: Diagnosis not present

## 2022-08-07 ENCOUNTER — Other Ambulatory Visit: Payer: Self-pay | Admitting: Family Medicine

## 2022-08-12 ENCOUNTER — Other Ambulatory Visit: Payer: Self-pay

## 2022-08-12 ENCOUNTER — Ambulatory Visit (INDEPENDENT_AMBULATORY_CARE_PROVIDER_SITE_OTHER): Payer: PPO

## 2022-08-12 DIAGNOSIS — Z23 Encounter for immunization: Secondary | ICD-10-CM | POA: Diagnosis not present

## 2022-08-12 DIAGNOSIS — I10 Essential (primary) hypertension: Secondary | ICD-10-CM

## 2022-08-12 MED ORDER — LEVOTHYROXINE SODIUM 25 MCG PO TABS: 25.0000 ug | ORAL_TABLET | Freq: Every day | ORAL | 1 refills | Status: DC

## 2022-08-12 MED ORDER — AMLODIPINE BESYLATE 2.5 MG PO TABS: 2.5000 mg | ORAL_TABLET | Freq: Every day | ORAL | 1 refills | Status: DC

## 2022-08-26 DIAGNOSIS — C44329 Squamous cell carcinoma of skin of other parts of face: Secondary | ICD-10-CM | POA: Diagnosis not present

## 2022-09-15 ENCOUNTER — Ambulatory Visit: Payer: PPO | Admitting: Family Medicine

## 2022-09-28 ENCOUNTER — Ambulatory Visit (INDEPENDENT_AMBULATORY_CARE_PROVIDER_SITE_OTHER): Payer: PPO | Admitting: Family Medicine

## 2022-09-28 ENCOUNTER — Encounter: Payer: Self-pay | Admitting: Family Medicine

## 2022-09-28 VITALS — BP 132/82 | HR 71 | Temp 97.3°F | Ht 59.0 in | Wt 127.8 lb

## 2022-09-28 DIAGNOSIS — E038 Other specified hypothyroidism: Secondary | ICD-10-CM | POA: Diagnosis not present

## 2022-09-28 DIAGNOSIS — M5136 Other intervertebral disc degeneration, lumbar region: Secondary | ICD-10-CM | POA: Diagnosis not present

## 2022-09-28 DIAGNOSIS — I471 Supraventricular tachycardia, unspecified: Secondary | ICD-10-CM

## 2022-09-28 DIAGNOSIS — I10 Essential (primary) hypertension: Secondary | ICD-10-CM

## 2022-09-28 DIAGNOSIS — I700 Atherosclerosis of aorta: Secondary | ICD-10-CM

## 2022-09-28 DIAGNOSIS — K219 Gastro-esophageal reflux disease without esophagitis: Secondary | ICD-10-CM

## 2022-09-28 DIAGNOSIS — I69398 Other sequelae of cerebral infarction: Secondary | ICD-10-CM

## 2022-09-28 DIAGNOSIS — H539 Unspecified visual disturbance: Secondary | ICD-10-CM | POA: Diagnosis not present

## 2022-09-28 DIAGNOSIS — E782 Mixed hyperlipidemia: Secondary | ICD-10-CM | POA: Diagnosis not present

## 2022-09-28 NOTE — Progress Notes (Unsigned)
Subjective:  Patient ID: Elizabeth Bradley, female    DOB: Jun 28, 1930  Age: 89 y.o. MRN: [id number]  Chief Complaint  Patient presents with   Hypertension   Hyperlipidemia    Patient is a 89 year old white female who presents for follow-up of hyperlipidemia, hypothyroidism, hypertension, SVT, and vision loss secondary to stroke years ago. Hyperlipidemia: On Crestor 5 mg daily. Eats healthy. Tolerating well  Hypothyroidism: Currently on Synthroid 25 mcg once daily. Hypertension: Currently on amlodipine 2.5 mg once daily.  Has a history of strokes several years ago.  Currently on Plavix 75 mg once daily.  Initially she had a stroke in her left eye and lost vision and then approximately 6 months later she had a stroke in her right eye and lost vision.  She has significantly limited vision bilaterally and is legally blind.  She is living independent living and does very well. GERD: On Pepcid 20 mg once a day as needed. Takes it about 3-4 times per week. Marland Kitchen SVT currently will take diltiazem 120 mg once daily. Shoulder pain left: takes celebrex 100 mg daily as needed.      03/17/2022    1:39 PM 07/30/2021    9:54 AM 05/30/2021    9:53 AM 05/04/2021    9:53 AM 01/29/2021   10:03 AM  Depression screen PHQ 2/9  Decreased Interest 0 0 0 0 0  Down, Depressed, Hopeless 0 0 0 0 0  PHQ - 2 Score 0 0 0 0 0         05/04/2021    9:53 AM 05/30/2021    9:53 AM 07/30/2021    9:54 AM 03/17/2022    1:38 PM 09/28/2022   11:37 AM  Fall Risk  Falls in the past year? 0 0 0 0 0  Was there an injury with Fall? 0 0 0 0 0  Fall Risk Category Calculator 0 0 0 0 0  Fall Risk Category (Retired) Low Low Low Low   (RETIRED) Patient Fall Risk Level Low fall risk Low fall risk  Low fall risk   Patient at Risk for Falls Due to No Fall Risks No Fall Risks  No Fall Risks No Fall Risks  Fall risk Follow up  Falls evaluation completed Falls evaluation completed Falls evaluation completed Falls evaluation completed       Review of Systems  Constitutional:  Negative for appetite change, fatigue and fever.  HENT:  Negative for congestion, ear pain, sinus pressure and sore throat.   Respiratory:  Negative for cough, chest tightness, shortness of breath and wheezing.   Cardiovascular:  Negative for chest pain and palpitations.  Gastrointestinal:  Negative for abdominal pain, constipation, diarrhea, nausea and vomiting.  Genitourinary:  Negative for dysuria and hematuria.  Musculoskeletal:  Negative for arthralgias, back pain, joint swelling and myalgias.  Skin:  Negative for rash.  Neurological:  Negative for dizziness, weakness and headaches.  Psychiatric/Behavioral:  Negative for dysphoric mood. The patient is not nervous/anxious.     Current Outpatient Medications on File Prior to Visit  Medication Sig Dispense Refill   amLODipine (NORVASC) 2.5 MG tablet Take 1 tablet (2.5 mg total) by mouth daily. 90 tablet 1   celecoxib (CELEBREX) 100 MG capsule Take 1 capsule (100 mg total) by mouth daily at 6 (six) AM. 90 capsule 1   cetirizine (ZYRTEC) 10 MG chewable tablet Chew 10 mg by mouth daily.     Cholecalciferol (VITAMIN D-3) 25 MCG (1000 UT) CAPS Take by mouth.  clopidogrel (PLAVIX) 75 MG tablet Take 1 tablet (75 mg total) by mouth daily. 90 tablet 1   famotidine (PEPCID) 20 MG tablet Take 1 tablet (20 mg total) by mouth daily. 90 tablet 1   fluticasone (FLONASE) 50 MCG/ACT nasal spray Place 2 sprays into both nostrils daily. 16 g 6   levothyroxine (SYNTHROID) 25 MCG tablet Take 1 tablet (25 mcg total) by mouth daily. 90 tablet 1   rosuvastatin (CRESTOR) 5 MG tablet Take 1 tablet (5 mg total) by mouth daily. 90 tablet 0   diltiazem (TIAZAC) 120 MG 24 hr capsule Take 1 capsule (120 mg total) by mouth daily. 90 capsule 1   No current facility-administered medications on file prior to visit.   Past Medical History:  Diagnosis Date   Atrophy of thyroid    Cerebrovascular accident (CVA) ([place])     Essential hypertension    GERD (gastroesophageal reflux disease)    History of skin cancer    Ischemic optic neuropathy of left eye    Low back pain    Mixed hyperlipidemia    Osteoarthritis    Stroke ([place])    Vitamin D deficiency    Past Surgical History:  Procedure Laterality Date   ABDOMINAL HYSTERECTOMY     SALPINGOOPHORECTOMY     THYROIDECTOMY      Family History  Problem Relation Age of Onset   Hypertension Mother    Heart failure Mother    Heart failure Father    Cancer Sister        LUNG   Heart failure Brother    Hypertension Brother    Cancer Brother    Social History   Socioeconomic History   Marital status: Widowed    Spouse name: Not on file   Number of children: Not on file   Years of education: Not on file   Highest education level: Not on file  Occupational History   Not on file  Tobacco Use   Smoking status: Never   Smokeless tobacco: Never  Substance and Sexual Activity   Alcohol use: Never   Drug use: Never   Sexual activity: Not Currently  Other Topics Concern   Not on file  Social History Narrative   Not on file   Social Determinants of Health   Financial Resource Strain: Not on file  Food Insecurity: Not on file  Transportation Needs: Not on file  Physical Activity: Not on file  Stress: Not on file  Social Connections: Not on file    Objective:  BP 132/82 (BP Location: Left Arm, Patient Position: Sitting)   Pulse 71   Temp (!) 97.3 F (36.3 C) (Temporal)   Ht '4\' 11"'$  (1.499 m)   Wt 127 lb 12.8 oz (58 kg)   SpO2 97%   BMI 25.81 kg/m      09/28/2022   11:33 AM 05/21/2022    3:34 PM 03/17/2022    2:07 PM  BP/Weight  Systolic BP 696 295 284  Diastolic BP 82 60 64  Wt. (Lbs) 127.8 127   BMI 25.81 kg/m2 25.65 kg/m2     Physical Exam Vitals reviewed.  Constitutional:      Appearance: Normal appearance. She is normal weight.  Neck:     Vascular: No carotid bruit.  Cardiovascular:     Rate and Rhythm: Normal rate and  regular rhythm.     Heart sounds: Normal heart sounds.  Pulmonary:     Effort: Pulmonary effort is normal.  Breath sounds: Normal breath sounds.  Abdominal:     General: Abdomen is flat. Bowel sounds are normal.     Palpations: Abdomen is soft.     Tenderness: There is no abdominal tenderness.  Neurological:     Mental Status: She is alert and oriented to person, place, and time.  Psychiatric:        Mood and Affect: Mood normal.        Behavior: Behavior normal.     Diabetic Foot Exam - Simple   No data filed      Lab Results  Component Value Date   WBC 4.3 09/28/2022   HGB 13.1 09/28/2022   HCT 39.6 09/28/2022   PLT 246 09/28/2022   GLUCOSE 88 09/28/2022   CHOL 200 (H) 09/28/2022   TRIG 89 09/28/2022   HDL 100 09/28/2022   LDLCALC 84 09/28/2022   ALT 14 09/28/2022   AST 20 09/28/2022   NA 144 09/28/2022   K 4.6 09/28/2022   CL 105 09/28/2022   CREATININE 1.31 (H) 09/28/2022   BUN 23 09/28/2022   CO2 21 09/28/2022   TSH 2.840 09/28/2022      Assessment & Plan:    PSVT (paroxysmal supraventricular tachycardia) (HCC) Continue with diltiazem 120 mg once a day.  Essential hypertension Well controlled.  No changes to medicines. Currently on amlodipine 2.5 mg once daily.  Continue to work on eating a healthy diet and exercise.  Labs drawn today.    Atherosclerosis of aorta (HCC) The current medical regimen is effective;  continue present plan and medications.  Plavix and Crestor.  Gastroesophageal reflux disease without esophagitis The current medical regimen is effective;  continue present plan and medications.  Pepcid 20 mg once a day as needed. Takes it about 3-4 times per week. .   Secondary hypothyroidism Previously well controlled Continue Synthroid at current dose  Recheck TSH and adjust Synthroid as indicated    Degeneration of lumbar intervertebral disc The current medical regimen is effective;  continue present plan and medications.    Vision disturbance following CVA (cerebrovascular accident) Continue with Plavix and crestor.  Mixed hyperlipidemia Well controlled.  No changes to medicines. Crestor 5 mg daily. Eats healthy.  Continue to work on eating a healthy diet and exercise.  Labs drawn today.      No orders of the defined types were placed in this encounter.   Orders Placed This Encounter  Procedures   CBC with Differential/Platelet   Comprehensive metabolic panel   TSH   Lipid panel   Cardiovascular Risk Assessment     Follow-up: Return in about 6 months (around 03/29/2023) for awv, chronic fasting, 40 minutes please.  An After Visit Summary was printed and given to the patient.  I,Lauren M Auman,acting as a scribe for Rochel Brome, MD.,have documented all relevant documentation on the behalf of Rochel Brome, MD,as directed by  Rochel Brome, MD while in the presence of Rochel Brome, MD.    Rochel Brome, MD [place] [phone]

## 2022-09-29 LAB — CBC WITH DIFFERENTIAL/PLATELET
Basophils Absolute: 0 10*3/uL (ref 0.0–0.2)
Basos: 1 %
EOS (ABSOLUTE): 0.3 10*3/uL (ref 0.0–0.4)
Eos: 7 %
Hematocrit: 39.6 % (ref 34.0–46.6)
Hemoglobin: 13.1 g/dL (ref 11.1–15.9)
Immature Grans (Abs): 0 10*3/uL (ref 0.0–0.1)
Immature Granulocytes: 0 %
Lymphocytes Absolute: 1.6 10*3/uL (ref 0.7–3.1)
Lymphs: 37 %
MCH: 29.6 pg (ref 26.6–33.0)
MCHC: 33.1 g/dL (ref 31.5–35.7)
MCV: 89 fL (ref 79–97)
Monocytes Absolute: 0.6 10*3/uL (ref 0.1–0.9)
Monocytes: 13 %
Neutrophils Absolute: 1.8 10*3/uL (ref 1.4–7.0)
Neutrophils: 42 %
Platelets: 246 10*3/uL (ref 150–450)
RBC: 4.43 x10E6/uL (ref 3.77–5.28)
RDW: 12.4 % (ref 11.7–15.4)
WBC: 4.3 10*3/uL (ref 3.4–10.8)

## 2022-09-29 LAB — COMPREHENSIVE METABOLIC PANEL
ALT: 14 IU/L (ref 0–32)
AST: 20 IU/L (ref 0–40)
Albumin/Globulin Ratio: 1.8 (ref 1.2–2.2)
Albumin: 4.6 g/dL (ref 3.6–4.6)
Alkaline Phosphatase: 95 IU/L (ref 44–121)
BUN/Creatinine Ratio: 18 (ref 12–28)
BUN: 23 mg/dL (ref 10–36)
Bilirubin Total: 0.6 mg/dL (ref 0.0–1.2)
CO2: 21 mmol/L (ref 20–29)
Calcium: 10.1 mg/dL (ref 8.7–10.3)
Chloride: 105 mmol/L (ref 96–106)
Creatinine, Ser: 1.31 mg/dL — ABNORMAL HIGH (ref 0.57–1.00)
Globulin, Total: 2.5 g/dL (ref 1.5–4.5)
Glucose: 88 mg/dL (ref 70–99)
Potassium: 4.6 mmol/L (ref 3.5–5.2)
Sodium: 144 mmol/L (ref 134–144)
Total Protein: 7.1 g/dL (ref 6.0–8.5)
eGFR: 39 mL/min/{1.73_m2} — ABNORMAL LOW (ref >59)

## 2022-09-29 LAB — LIPID PANEL
Chol/HDL Ratio: 2 ratio (ref 0.0–4.4)
Cholesterol, Total: 200 mg/dL — ABNORMAL HIGH (ref 100–199)
HDL: 100 mg/dL (ref >39)
LDL Chol Calc (NIH): 84 mg/dL (ref 0–99)
Triglycerides: 89 mg/dL (ref 0–149)
VLDL Cholesterol Cal: 16 mg/dL (ref 5–40)

## 2022-09-29 LAB — CARDIOVASCULAR RISK ASSESSMENT

## 2022-09-29 LAB — TSH: TSH: 2.84 u[IU]/mL (ref 0.450–4.500)

## 2022-10-03 NOTE — Assessment & Plan Note (Signed)
Well controlled.  No changes to medicines. Crestor 5 mg daily. Eats healthy.  Continue to work on eating a healthy diet and exercise.  Labs drawn today.

## 2022-10-03 NOTE — Assessment & Plan Note (Signed)
Well controlled.  No changes to medicines. Currently on amlodipine 2.5 mg once daily.  Continue to work on eating a healthy diet and exercise.  Labs drawn today.

## 2022-10-03 NOTE — Assessment & Plan Note (Signed)
Continue with diltiazem 120 mg once a day.

## 2022-10-03 NOTE — Assessment & Plan Note (Signed)
The current medical regimen is effective;  continue present plan and medications.  

## 2022-10-03 NOTE — Assessment & Plan Note (Signed)
Previously well controlled Continue Synthroid at current dose  Recheck TSH and adjust Synthroid as indicated   

## 2022-10-03 NOTE — Assessment & Plan Note (Signed)
The current medical regimen is effective;  continue present plan and medications.  Plavix and Crestor.

## 2022-10-03 NOTE — Assessment & Plan Note (Signed)
Continue with Plavix and crestor.

## 2022-10-03 NOTE — Assessment & Plan Note (Signed)
The current medical regimen is effective;  continue present plan and medications.  Pepcid 20 mg once a day as needed. Takes it about 3-4 times per week. Marland Kitchen

## 2022-10-06 DIAGNOSIS — L578 Other skin changes due to chronic exposure to nonionizing radiation: Secondary | ICD-10-CM | POA: Diagnosis not present

## 2022-10-06 DIAGNOSIS — C44329 Squamous cell carcinoma of skin of other parts of face: Secondary | ICD-10-CM | POA: Diagnosis not present

## 2022-10-06 DIAGNOSIS — L570 Actinic keratosis: Secondary | ICD-10-CM | POA: Diagnosis not present

## 2022-10-27 ENCOUNTER — Other Ambulatory Visit: Payer: Self-pay | Admitting: Family Medicine

## 2022-10-27 DIAGNOSIS — E782 Mixed hyperlipidemia: Secondary | ICD-10-CM

## 2022-12-14 DIAGNOSIS — I351 Nonrheumatic aortic (valve) insufficiency: Secondary | ICD-10-CM | POA: Diagnosis not present

## 2023-01-15 ENCOUNTER — Other Ambulatory Visit: Payer: Self-pay | Admitting: Family Medicine

## 2023-01-15 DIAGNOSIS — M51369 Other intervertebral disc degeneration, lumbar region without mention of lumbar back pain or lower extremity pain: Secondary | ICD-10-CM

## 2023-01-15 DIAGNOSIS — M5136 Other intervertebral disc degeneration, lumbar region: Secondary | ICD-10-CM

## 2023-01-15 DIAGNOSIS — K219 Gastro-esophageal reflux disease without esophagitis: Secondary | ICD-10-CM

## 2023-01-15 DIAGNOSIS — E782 Mixed hyperlipidemia: Secondary | ICD-10-CM

## 2023-01-29 DIAGNOSIS — H4720 Unspecified optic atrophy: Secondary | ICD-10-CM | POA: Diagnosis not present

## 2023-02-22 ENCOUNTER — Other Ambulatory Visit: Payer: Self-pay | Admitting: Family Medicine

## 2023-02-22 DIAGNOSIS — I10 Essential (primary) hypertension: Secondary | ICD-10-CM

## 2023-03-23 DIAGNOSIS — E782 Mixed hyperlipidemia: Secondary | ICD-10-CM | POA: Diagnosis not present

## 2023-03-23 DIAGNOSIS — I471 Supraventricular tachycardia, unspecified: Secondary | ICD-10-CM | POA: Diagnosis not present

## 2023-03-23 DIAGNOSIS — I10 Essential (primary) hypertension: Secondary | ICD-10-CM | POA: Diagnosis not present

## 2023-03-29 ENCOUNTER — Ambulatory Visit (INDEPENDENT_AMBULATORY_CARE_PROVIDER_SITE_OTHER): Payer: PPO | Admitting: Family Medicine

## 2023-03-29 ENCOUNTER — Encounter: Payer: Self-pay | Admitting: Family Medicine

## 2023-03-29 VITALS — BP 110/50 | HR 78 | Temp 96.7°F | Resp 16 | Ht 59.0 in | Wt 125.4 lb

## 2023-03-29 DIAGNOSIS — E038 Other specified hypothyroidism: Secondary | ICD-10-CM | POA: Diagnosis not present

## 2023-03-29 DIAGNOSIS — Z0001 Encounter for general adult medical examination with abnormal findings: Secondary | ICD-10-CM | POA: Diagnosis not present

## 2023-03-29 DIAGNOSIS — I10 Essential (primary) hypertension: Secondary | ICD-10-CM

## 2023-03-29 DIAGNOSIS — L989 Disorder of the skin and subcutaneous tissue, unspecified: Secondary | ICD-10-CM

## 2023-03-29 DIAGNOSIS — R011 Cardiac murmur, unspecified: Secondary | ICD-10-CM

## 2023-03-29 DIAGNOSIS — E782 Mixed hyperlipidemia: Secondary | ICD-10-CM

## 2023-03-29 DIAGNOSIS — Z0000 Encounter for general adult medical examination without abnormal findings: Secondary | ICD-10-CM | POA: Insufficient documentation

## 2023-03-29 DIAGNOSIS — I700 Atherosclerosis of aorta: Secondary | ICD-10-CM

## 2023-03-29 DIAGNOSIS — R1319 Other dysphagia: Secondary | ICD-10-CM | POA: Diagnosis not present

## 2023-03-29 NOTE — Assessment & Plan Note (Signed)
Stop amlodipine. Continue healthy diet and exercise as tolerated.

## 2023-03-29 NOTE — Patient Instructions (Addendum)
Needs Tdap and Shingrix at the local pharmacy.   Stop amlodipine.

## 2023-03-29 NOTE — Assessment & Plan Note (Signed)
Referral to Dermatology

## 2023-03-29 NOTE — Progress Notes (Unsigned)
Subjective:  Patient ID: Elizabeth Bradley, female    DOB: Apr 19, 1930  Age: 89 y.o. MRN: [id number]  No chief complaint on file.   HPI @HPI @ Well Adult Physical: Patient here for a comprehensive physical exam.The patient reports  skin lesion right forehead.  Do you take any herbs or supplements that were not prescribed by a doctor? no Are you taking calcium supplements? no Are you taking aspirin daily? no  Physical ("At Risk" items are starred): Patient's last physical exam was 1 year ago .  Safety: reviewed ; Patient wears a seat belt, has smoke detectors, has carbon monoxide detectors, practices appropriate gun safety, and wears sunscreen with extended sun exposure. Dental Care: currently not seeing a dentist regularly but she  brushes daily.   Ophthalmology/Optometry: Annual visit.  Hearing loss: none Vision impairments: yes     09/28/2022   11:37 AM 03/17/2022    1:38 PM 07/30/2021    9:54 AM 05/30/2021    9:53 AM 05/04/2021    9:53 AM  Fall Risk   Falls in the past year? 0 0 0 0 0  Number falls in past yr: 0 0 0 0 0  Injury with Fall? 0 0 0 0 0  Risk for fall due to : No Fall Risks No Fall Risks  No Fall Risks No Fall Risks  Follow up Falls evaluation completed Falls evaluation completed Falls evaluation completed Falls evaluation completed         03/17/2022    1:39 PM 07/30/2021    9:54 AM 05/30/2021    9:53 AM 05/04/2021    9:53 AM 01/29/2021   10:03 AM  Depression screen PHQ 2/9  Decreased Interest 0 0 0 0 0  Down, Depressed, Hopeless 0 0 0 0 0  PHQ - 2 Score 0 0 0 0 0       Functional Status Survey:     . Health Maintenance  Topic Date Due   DTaP/Tdap/Td vaccine (1 - Tdap) Never done   Zoster (Shingles) Vaccine (2 of 2) 07/21/2017   Medicare Annual Wellness Visit  05/04/2022   COVID-19 Vaccine (4 - 2023-24 season) 05/12/2022   Flu Shot  04/08/2023   Pneumonia Vaccine  Completed   DEXA scan (bone density measurement)  Completed   HPV Vaccine  Aged Out      Social Hx   Social History   Socioeconomic History   Marital status: Widowed    Spouse name: Not on file   Number of children: Not on file   Years of education: Not on file   Highest education level: Not on file  Occupational History   Not on file  Tobacco Use   Smoking status: Never   Smokeless tobacco: Never  Substance and Sexual Activity   Alcohol use: Never   Drug use: Never   Sexual activity: Not Currently  Other Topics Concern   Not on file  Social History Narrative   Not on file   Social Determinants of Health   Financial Resource Strain: Low Risk  (03/29/2023)   Overall Financial Resource Strain (CARDIA)    Difficulty of Paying Living Expenses: Not very hard  Food Insecurity: No Food Insecurity (03/29/2023)   Hunger Vital Sign    Worried About Running Out of Food in the Last Year: Never true    Ran Out of Food in the Last Year: Never true  Transportation Needs: No Transportation Needs (03/29/2023)   PRAPARE - Transportation    Lack of  Transportation (Medical): No    Lack of Transportation (Non-Medical): No  Physical Activity: Not on file  Stress: No Stress Concern Present (03/29/2023)   [employer] of Occupational Health - Occupational Stress Questionnaire    Feeling of Stress : Only a little  Social Connections: Moderately Integrated (03/29/2023)   Social Connection and Isolation Panel [NHANES]    Frequency of Communication with Friends and Family: More than three times a week    Frequency of Social Gatherings with Friends and Family: More than three times a week    Attends Religious Services: More than 4 times per year    Active Member of [employer] or Organizations: Yes    Attends Banker Meetings: More than 4 times per year    Marital Status: Widowed   Past Medical History:  Diagnosis Date   Atrophy of thyroid    Cerebrovascular accident (CVA) (HCC)    Essential hypertension    GERD (gastroesophageal reflux disease)    History of skin  cancer    Ischemic optic neuropathy of left eye    Low back pain    Mixed hyperlipidemia    Osteoarthritis    Stroke (HCC)    Vitamin D deficiency    Family History  Problem Relation Age of Onset   Hypertension Mother    Heart failure Mother    Heart failure Father    Cancer Sister        LUNG   Heart failure Brother    Hypertension Brother    Cancer Brother     Review of Systems  Constitutional:  Negative for chills, fatigue and fever.  HENT:  Negative for congestion, rhinorrhea and sore throat.   Respiratory:  Negative for cough and shortness of breath.   Cardiovascular:  Positive for leg swelling. Negative for chest pain.  Gastrointestinal:  Negative for abdominal pain, constipation, diarrhea, nausea and vomiting.       Difficulty swallowing   Genitourinary:  Negative for dysuria and urgency.  Musculoskeletal:  Negative for back pain and myalgias.  Skin:        Skin lesion right forehead.    Neurological:  Negative for dizziness, weakness, light-headedness and headaches.  Psychiatric/Behavioral:  Negative for dysphoric mood. The patient is not nervous/anxious.      Objective:      03/29/2023   10:16 AM 09/28/2022   11:33 AM 05/21/2022    3:34 PM  Vitals with BMI  Height 4\' 11"  4\' 11"  4\' 11"   Weight 125 lbs 6 oz 127 lbs 13 oz 127 lbs  BMI 25.31 25.8 25.64  Systolic 110 132 474  Diastolic 50 82 60  Pulse 78 71 70    No data found.   Physical Exam Vitals reviewed.  Constitutional:      Appearance: Normal appearance. She is normal weight.  Neck:     Vascular: No carotid bruit.  Cardiovascular:     Rate and Rhythm: Normal rate and regular rhythm.     Heart sounds: Murmur heard.  Pulmonary:     Effort: Pulmonary effort is normal. No respiratory distress.     Breath sounds: Normal breath sounds.  Abdominal:     General: Abdomen is flat. Bowel sounds are normal.     Palpations: Abdomen is soft.     Tenderness: There is no abdominal tenderness.   Neurological:     Mental Status: She is alert and oriented to person, place, and time.  Psychiatric:  Mood and Affect: Mood normal.        Behavior: Behavior normal.     Lab Results  Component Value Date   WBC 4.3 09/28/2022   HGB 13.1 09/28/2022   HCT 39.6 09/28/2022   PLT 246 09/28/2022   GLUCOSE 88 09/28/2022   CHOL 200 (H) 09/28/2022   TRIG 89 09/28/2022   HDL 100 09/28/2022   LDLCALC 84 09/28/2022   ALT 14 09/28/2022   AST 20 09/28/2022   NA 144 09/28/2022   K 4.6 09/28/2022   CL 105 09/28/2022   CREATININE 1.31 (H) 09/28/2022   BUN 23 09/28/2022   CO2 21 09/28/2022   TSH 2.840 09/28/2022      Assessment & Plan:   Problem List Items Addressed This Visit   None    No orders of the defined types were placed in this encounter.   These are the goals we discussed:  Goals   None      This is a list of the screening recommended for you and due dates:  Health Maintenance  Topic Date Due   DTaP/Tdap/Td vaccine (1 - Tdap) Never done   Zoster (Shingles) Vaccine (2 of 2) 07/21/2017   Medicare Annual Wellness Visit  05/04/2022   COVID-19 Vaccine (4 - 2023-24 season) 05/12/2022   Flu Shot  04/08/2023   Pneumonia Vaccine  Completed   DEXA scan (bone density measurement)  Completed   HPV Vaccine  Aged Out     AN INDIVIDUALIZED CARE PLAN: was established or reinforced today.   SELF MANAGEMENT: The patient and I together assessed ways to personally work towards obtaining the recommended goals  Support needs The patient and/or family needs were assessed and services were offered and not necessary at this time.    Follow-up: No follow-ups on file.  Blane Ohara, MD [facility] [phone]

## 2023-03-29 NOTE — Assessment & Plan Note (Signed)
Continue levothyroxine. Labs draw.

## 2023-03-30 LAB — CBC WITH DIFFERENTIAL/PLATELET
Basophils Absolute: 0 10*3/uL (ref 0.0–0.2)
Basos: 0 %
EOS (ABSOLUTE): 0.4 10*3/uL (ref 0.0–0.4)
Eos: 8 %
Hematocrit: 35.9 % (ref 34.0–46.6)
Hemoglobin: 11.5 g/dL (ref 11.1–15.9)
Immature Grans (Abs): 0 10*3/uL (ref 0.0–0.1)
Immature Granulocytes: 0 %
Lymphocytes Absolute: 1.9 10*3/uL (ref 0.7–3.1)
Lymphs: 38 %
MCH: 29.6 pg (ref 26.6–33.0)
MCHC: 32 g/dL (ref 31.5–35.7)
MCV: 92 fL (ref 79–97)
Monocytes Absolute: 0.5 10*3/uL (ref 0.1–0.9)
Monocytes: 11 %
Neutrophils Absolute: 2 10*3/uL (ref 1.4–7.0)
Neutrophils: 43 %
Platelets: 207 10*3/uL (ref 150–450)
RBC: 3.89 x10E6/uL (ref 3.77–5.28)
RDW: 12.7 % (ref 11.7–15.4)
WBC: 4.9 10*3/uL (ref 3.4–10.8)

## 2023-03-30 LAB — CMP14+EGFR
ALT: 11 IU/L (ref 0–32)
AST: 17 IU/L (ref 0–40)
Albumin: 4.2 g/dL (ref 3.6–4.6)
Alkaline Phosphatase: 82 IU/L (ref 44–121)
BUN/Creatinine Ratio: 21 (ref 12–28)
BUN: 27 mg/dL (ref 10–36)
Bilirubin Total: 0.5 mg/dL (ref 0.0–1.2)
CO2: 18 mmol/L — ABNORMAL LOW (ref 20–29)
Calcium: 9.8 mg/dL (ref 8.7–10.3)
Chloride: 109 mmol/L — ABNORMAL HIGH (ref 96–106)
Creatinine, Ser: 1.31 mg/dL — ABNORMAL HIGH (ref 0.57–1.00)
Globulin, Total: 2.4 g/dL (ref 1.5–4.5)
Glucose: 88 mg/dL (ref 70–99)
Potassium: 4.5 mmol/L (ref 3.5–5.2)
Sodium: 144 mmol/L (ref 134–144)
Total Protein: 6.6 g/dL (ref 6.0–8.5)
eGFR: 39 mL/min/{1.73_m2} — ABNORMAL LOW (ref >59)

## 2023-03-30 LAB — LIPID PANEL
Chol/HDL Ratio: 2.1 ratio (ref 0.0–4.4)
Cholesterol, Total: 187 mg/dL (ref 100–199)
HDL: 90 mg/dL (ref >39)
LDL Chol Calc (NIH): 81 mg/dL (ref 0–99)
Triglycerides: 88 mg/dL (ref 0–149)
VLDL Cholesterol Cal: 16 mg/dL (ref 5–40)

## 2023-03-30 LAB — TSH: TSH: 1.79 u[IU]/mL (ref 0.450–4.500)

## 2023-03-31 ENCOUNTER — Encounter: Payer: Self-pay | Admitting: Family Medicine

## 2023-03-31 DIAGNOSIS — R011 Cardiac murmur, unspecified: Secondary | ICD-10-CM | POA: Insufficient documentation

## 2023-03-31 NOTE — Assessment & Plan Note (Signed)
Continue simvastatin and plavix 75 mg once daily.

## 2023-03-31 NOTE — Assessment & Plan Note (Signed)
The current medical regimen is effective;  continue present plan and medications. Continue crestor 5 mg daily Check labs.

## 2023-03-31 NOTE — Assessment & Plan Note (Signed)
Refer to gastroenterology.  Patient did not want to do a swallowing study.  She has a history of dilatation of her esophagus.

## 2023-03-31 NOTE — Assessment & Plan Note (Signed)
ECHO 12/2022 SUMMARY  Normal LV size, wall thickness, wall motion and systolic function with ejection fraction 60-65%  There is aortic valve sclerosis.  There is no aortic stenosis.  There is mild aortic regurgitation.  There is no comparison study available.

## 2023-03-31 NOTE — Assessment & Plan Note (Signed)
Education given. 

## 2023-04-02 DIAGNOSIS — L570 Actinic keratosis: Secondary | ICD-10-CM | POA: Diagnosis not present

## 2023-04-13 ENCOUNTER — Telehealth: Payer: Self-pay

## 2023-04-13 ENCOUNTER — Other Ambulatory Visit: Payer: Self-pay

## 2023-04-13 MED ORDER — LEVOTHYROXINE SODIUM 25 MCG PO TABS: 25.0000 ug | ORAL_TABLET | Freq: Every day | ORAL | 1 refills | Status: DC

## 2023-04-13 NOTE — Telephone Encounter (Signed)
Prescription Request  04/13/2023   What is the name of the medication or equipment?  levothyroxine (SYNTHROID) 25 MCG tablet  Have you contacted your pharmacy to request a refill? No   Which pharmacy would you like this sent to? The patient has changed her pharmacy to the following:  [id number] - [place], [place] - [address] AT [facility] [address] [place] [address] Phone: [phone] Fax: [phone]    Patient notified that their request is being sent to the clinical staff for review and that they should receive a response within 2 business days.   Please advise at [facility] [phone]

## 2023-04-14 ENCOUNTER — Other Ambulatory Visit: Payer: Self-pay

## 2023-04-14 ENCOUNTER — Telehealth: Payer: Self-pay

## 2023-04-14 NOTE — Telephone Encounter (Signed)
Elizabeth Bradley called to confirm that her PRESCRIPTION was sent to her new pharmacy [facility] DD).  She was informed that the medication was sent yesterday afternoon.  She will let us know when she needs the other medications and we can transfer them over.

## 2023-04-27 ENCOUNTER — Telehealth: Payer: Self-pay

## 2023-04-27 ENCOUNTER — Other Ambulatory Visit: Payer: Self-pay

## 2023-04-27 MED ORDER — CLOPIDOGREL BISULFATE 75 MG PO TABS: 75.0000 mg | ORAL_TABLET | Freq: Every day | ORAL | 1 refills | Status: DC

## 2023-04-27 NOTE — Telephone Encounter (Signed)
Prescription Request  04/27/2023   What is the name of the medication or equipment?  clopidogrel (PLAVIX) 75 MG tablet   Have you contacted your pharmacy to request a refill? No   Which pharmacy would you like this sent to?  [id number] [place], [place] - [address] AT [facility] [address] [facility] [address] [place] [address] Phone: [phone] Fax: [phone]    Patient notified that their request is being sent to the clinical staff for review and that they should receive a response within 2 business days.   Please advise at [facility] [phone]

## 2023-05-20 DIAGNOSIS — M9902 Segmental and somatic dysfunction of thoracic region: Secondary | ICD-10-CM | POA: Diagnosis not present

## 2023-05-20 DIAGNOSIS — M542 Cervicalgia: Secondary | ICD-10-CM | POA: Diagnosis not present

## 2023-05-20 DIAGNOSIS — M9901 Segmental and somatic dysfunction of cervical region: Secondary | ICD-10-CM | POA: Diagnosis not present

## 2023-05-20 DIAGNOSIS — M4712 Other spondylosis with myelopathy, cervical region: Secondary | ICD-10-CM | POA: Diagnosis not present

## 2023-05-27 DIAGNOSIS — M9901 Segmental and somatic dysfunction of cervical region: Secondary | ICD-10-CM | POA: Diagnosis not present

## 2023-05-27 DIAGNOSIS — M4712 Other spondylosis with myelopathy, cervical region: Secondary | ICD-10-CM | POA: Diagnosis not present

## 2023-05-27 DIAGNOSIS — M542 Cervicalgia: Secondary | ICD-10-CM | POA: Diagnosis not present

## 2023-05-27 DIAGNOSIS — M9902 Segmental and somatic dysfunction of thoracic region: Secondary | ICD-10-CM | POA: Diagnosis not present

## 2023-06-10 DIAGNOSIS — M4712 Other spondylosis with myelopathy, cervical region: Secondary | ICD-10-CM | POA: Diagnosis not present

## 2023-06-10 DIAGNOSIS — M9901 Segmental and somatic dysfunction of cervical region: Secondary | ICD-10-CM | POA: Diagnosis not present

## 2023-06-10 DIAGNOSIS — M9902 Segmental and somatic dysfunction of thoracic region: Secondary | ICD-10-CM | POA: Diagnosis not present

## 2023-06-10 DIAGNOSIS — M542 Cervicalgia: Secondary | ICD-10-CM | POA: Diagnosis not present

## 2023-06-22 ENCOUNTER — Other Ambulatory Visit: Payer: Self-pay | Admitting: Pharmacist

## 2023-06-22 NOTE — Progress Notes (Signed)
Pharmacy Quality Measure Review  This patient is appearing on a report for being at risk of failing the adherence measure for cholesterol (statin) medications this calendar year.   Medication: rosuvastatin 5 mg  Last fill date: 5/13 for 90 day supply  Discussed barriers to adherence including poor vision and sometimes electing not to take medication if she did not eat much that day. Patient also recently switched pharmacies to the [facility] on [address] in [place] as this is more convenient for her daughter to pick up her medications. Currently patient writes the first letter of each medication name on the bottle's lid and also keeps certain bottles in different places throughout the house to help her tell the difference between medications. Admits to only taking diltiazem as needed and skipping doses of rosuvastatin. Patient notes that she is 41 and doing well, does not want to take anything if she does not need to. Reports her daughter helps pick up medications from the pharmacy since she does not drive. I recommended asking her daughter to help fill a weekly pill box to ensure she is taking medications correctly. Patient reported she did not want to change her medication routine and did not like pill boxes. She requested new prescriptions for maintenance medications be sent to [facility]. Will collaborate with PCP to have refills sent to new pharmacy.  Jarrett Ables, PharmD PGY-1 Pharmacy Resident

## 2023-06-24 ENCOUNTER — Telehealth: Payer: Self-pay

## 2023-06-24 NOTE — Telephone Encounter (Signed)
Patient called requesting you refill/restart her on amlodipine. States that since it was stopped she just hasn't felt "normal". Denied any abnormal blood pressure readings and states she only takes cardizem as needed. She stated at times when she goes to fix her breakfast sometimes she feels weak. Offered to schedule patient appointment to discuss this issue, however she declined. I informed her I would still send a message.Marland KitchenMarland Kitchen

## 2023-06-27 ENCOUNTER — Other Ambulatory Visit: Payer: Self-pay | Admitting: Family Medicine

## 2023-06-28 MED ORDER — AMLODIPINE BESYLATE 2.5 MG PO TABS: 2.5000 mg | ORAL_TABLET | Freq: Every day | ORAL | 1 refills | Status: DC

## 2023-06-28 NOTE — Telephone Encounter (Signed)
Patient informed. Has appt tomorrow

## 2023-06-29 ENCOUNTER — Ambulatory Visit (INDEPENDENT_AMBULATORY_CARE_PROVIDER_SITE_OTHER): Payer: PPO | Admitting: Family Medicine

## 2023-06-29 VITALS — BP 142/60 | HR 60 | Temp 95.3°F | Resp 14 | Ht 59.0 in | Wt 128.4 lb

## 2023-06-29 DIAGNOSIS — I10 Essential (primary) hypertension: Secondary | ICD-10-CM | POA: Diagnosis not present

## 2023-06-29 DIAGNOSIS — I471 Supraventricular tachycardia, unspecified: Secondary | ICD-10-CM | POA: Diagnosis not present

## 2023-06-29 DIAGNOSIS — Z23 Encounter for immunization: Secondary | ICD-10-CM | POA: Diagnosis not present

## 2023-06-29 MED ORDER — DILTIAZEM HCL 30 MG PO TABS: 30.0000 mg | ORAL_TABLET | Freq: Two times a day (BID) | ORAL | 0 refills | Status: DC | PRN

## 2023-06-29 NOTE — Patient Instructions (Signed)
RESTART Amlodipine 2.5 mg every day. USE diltiazem 30 mg twice daily as needed for irregular rhythm.

## 2023-06-29 NOTE — Progress Notes (Signed)
Subjective:  Patient ID: Elizabeth Bradley, female    DOB: May 01, 1930  Age: 89 y.o. MRN: [id number]  Chief Complaint  Patient presents with   Hypertension    HPI Hypertension: amlodipine 2.5 mg once a day worked for her bp.  Palpitations: This happens when she exerts herself. She would take diltiazem Er 120 mg once daily as needed. She would take a second and it still did not help     06/29/2023    4:05 PM 03/29/2023   10:32 AM 03/17/2022    1:39 PM 07/30/2021    9:54 AM 05/30/2021    9:53 AM  Depression screen PHQ 2/9  Decreased Interest 0 0 0 0 0  Down, Depressed, Hopeless 0 0 0 0 0  PHQ - 2 Score 0 0 0 0 0        06/29/2023    4:05 PM  Fall Risk   Falls in the past year? 0  Number falls in past yr: 0  Injury with Fall? 0  Risk for fall due to : Impaired balance/gait  Follow up Falls evaluation completed;Falls prevention discussed    Patient Care Team: Lamondre Wesche, Fritzi Mandes, MD as PCP - General (Family Medicine)   Review of Systems  Constitutional:  Negative for chills, fatigue and fever.  HENT:  Negative for congestion, ear pain, rhinorrhea and sore throat.   Respiratory:  Negative for cough and shortness of breath.   Cardiovascular:  Negative for chest pain.  Gastrointestinal:  Negative for abdominal pain, constipation, diarrhea, nausea and vomiting.  Genitourinary:  Negative for dysuria and urgency.  Musculoskeletal:  Negative for back pain and myalgias.  Neurological:  Positive for dizziness, weakness and headaches. Negative for light-headedness.  Psychiatric/Behavioral:  Negative for dysphoric mood. The patient is not nervous/anxious.     Current Outpatient Medications on File Prior to Visit  Medication Sig Dispense Refill   amLODipine (NORVASC) 2.5 MG tablet Take 1 tablet (2.5 mg total) by mouth daily. 90 tablet 1   celecoxib (CELEBREX) 100 MG capsule Take 1 capsule by mouth once daily at 6am. 90 capsule 1   cetirizine (ZYRTEC) 10 MG chewable tablet Chew 10 mg by  mouth daily.     Cholecalciferol (VITAMIN D-3) 25 MCG (1000 UT) CAPS Take by mouth.     clopidogrel (PLAVIX) 75 MG tablet Take 1 tablet (75 mg total) by mouth daily. 90 tablet 1   diltiazem (CARDIZEM CD) 120 MG 24 hr capsule Take 1 capsule (120 mg total) by mouth daily. 90 capsule 1   famotidine (PEPCID) 20 MG tablet Take 1 tablet by mouth once daily. 90 tablet 1   fluticasone (FLONASE) 50 MCG/ACT nasal spray Place 2 sprays into both nostrils daily. 16 g 6   levothyroxine (SYNTHROID) 25 MCG tablet Take 1 tablet (25 mcg total) by mouth daily. 90 tablet 1   rosuvastatin (CRESTOR) 5 MG tablet Take 1 tablet by mouth once daily. 90 tablet 1   No current facility-administered medications on file prior to visit.   Past Medical History:  Diagnosis Date   Atrophy of thyroid    Cerebrovascular accident (CVA) (HCC)    Essential hypertension    GERD (gastroesophageal reflux disease)    History of skin cancer    Ischemic optic neuropathy of left eye    Low back pain    Mixed hyperlipidemia    Osteoarthritis    Stroke (HCC)    Vitamin D deficiency    Past Surgical History:  Procedure Laterality Date  ABDOMINAL HYSTERECTOMY     SALPINGOOPHORECTOMY     THYROIDECTOMY      Family History  Problem Relation Age of Onset   Hypertension Mother    Heart failure Mother    Heart failure Father    Cancer Sister        LUNG   Heart failure Brother    Hypertension Brother    Cancer Brother    Social History   Socioeconomic History   Marital status: Widowed    Spouse name: Not on file   Number of children: Not on file   Years of education: Not on file   Highest education level: Not on file  Occupational History   Not on file  Tobacco Use   Smoking status: Never   Smokeless tobacco: Never  Substance and Sexual Activity   Alcohol use: Never   Drug use: Never   Sexual activity: Not Currently  Other Topics Concern   Not on file  Social History Narrative   Not on file   Social  Determinants of Health   Financial Resource Strain: Low Risk  (03/29/2023)   Overall Financial Resource Strain (CARDIA)    Difficulty of Paying Living Expenses: Not very hard  Food Insecurity: No Food Insecurity (03/29/2023)   Hunger Vital Sign    Worried About Running Out of Food in the Last Year: Never true    Ran Out of Food in the Last Year: Never true  Transportation Needs: No Transportation Needs (03/29/2023)   PRAPARE - Administrator, Civil Service (Medical): No    Lack of Transportation (Non-Medical): No  Physical Activity: Insufficiently Active (03/29/2023)   Exercise Vital Sign    Days of Exercise per Week: 5 days    Minutes of Exercise per Session: 20 min  Stress: No Stress Concern Present (03/29/2023)   [employer] of Occupational Health - Occupational Stress Questionnaire    Feeling of Stress : Only a little  Social Connections: Moderately Integrated (03/29/2023)   Social Connection and Isolation Panel [NHANES]    Frequency of Communication with Friends and Family: More than three times a week    Frequency of Social Gatherings with Friends and Family: More than three times a week    Attends Religious Services: More than 4 times per year    Active Member of [employer] or Organizations: Yes    Attends Banker Meetings: More than 4 times per year    Marital Status: Widowed    Objective:  BP (!) 142/60   Pulse 60   Temp (!) 95.3 F (35.2 C)   Resp 14   Ht 4\' 11"  (1.499 m)   Wt 128 lb 6.4 oz (58.2 kg)   BMI 25.93 kg/m      06/29/2023    4:40 PM 06/29/2023    4:00 PM 03/29/2023   10:16 AM  BP/Weight  Systolic BP 142 146 110  Diastolic BP 60 60 50  Wt. (Lbs)  128.4 125.4  BMI  25.93 kg/m2 25.33 kg/m2    Physical Exam Vitals reviewed.  Constitutional:      Appearance: Normal appearance. She is normal weight.  Cardiovascular:     Rate and Rhythm: Normal rate and regular rhythm.     Heart sounds: Normal heart sounds.  Pulmonary:      Effort: Pulmonary effort is normal. No respiratory distress.     Breath sounds: Normal breath sounds.  Neurological:     Mental Status: She is alert and oriented  to person, place, and time.  Psychiatric:        Mood and Affect: Mood normal.        Behavior: Behavior normal.     Diabetic Foot Exam - Simple   No data filed      Lab Results  Component Value Date   WBC 4.9 03/29/2023   HGB 11.5 03/29/2023   HCT 35.9 03/29/2023   PLT 207 03/29/2023   GLUCOSE 88 03/29/2023   CHOL 187 03/29/2023   TRIG 88 03/29/2023   HDL 90 03/29/2023   LDLCALC 81 03/29/2023   ALT 11 03/29/2023   AST 17 03/29/2023   NA 144 03/29/2023   K 4.5 03/29/2023   CL 109 (H) 03/29/2023   CREATININE 1.31 (H) 03/29/2023   BUN 27 03/29/2023   CO2 18 (L) 03/29/2023   TSH 1.790 03/29/2023      Assessment & Plan:    Essential hypertension Assessment & Plan: RESTART Amlodipine 2.5 mg every day.    PSVT (paroxysmal supraventricular tachycardia) (HCC) Assessment & Plan: USE diltiazem 30 mg twice daily as needed for irregular rhythm.   Orders: -     dilTIAZem HCl; Take 1 tablet (30 mg total) by mouth 2 (two) times daily as needed (palpitations).  Dispense: 60 tablet; Refill: 0  Encounter for immunization -     Flu Vaccine Trivalent High Dose (Fluad)     Meds ordered this encounter  Medications   diltiazem (CARDIZEM) 30 MG tablet    Sig: Take 1 tablet (30 mg total) by mouth 2 (two) times daily as needed (palpitations).    Dispense:  60 tablet    Refill:  0    Orders Placed This Encounter  Procedures   Flu Vaccine Trivalent High Dose (Fluad)     Follow-up: Return in about 3 months (around 09/29/2023).   I,Katherina A Bramblett,acting as a scribe for Blane Ohara, MD.,have documented all relevant documentation on the behalf of Blane Ohara, MD,as directed by  Blane Ohara, MD while in the presence of Blane Ohara, MD.   Clayborn Bigness I Leal-Borjas,acting as a scribe for Blane Ohara, MD.,have  documented all relevant documentation on the behalf of Blane Ohara, MD,as directed by  Blane Ohara, MD while in the presence of Blane Ohara, MD.    An After Visit Summary was printed and given to the patient.  I attest that I have reviewed this visit and agree with the plan scribed by my staff.   Blane Ohara, MD [facility] [phone]

## 2023-07-03 ENCOUNTER — Encounter: Payer: Self-pay | Admitting: Family Medicine

## 2023-07-03 DIAGNOSIS — Z23 Encounter for immunization: Secondary | ICD-10-CM | POA: Insufficient documentation

## 2023-07-03 NOTE — Assessment & Plan Note (Addendum)
RESTART Amlodipine 2.5 mg every day.

## 2023-07-03 NOTE — Assessment & Plan Note (Signed)
USE diltiazem 30 mg twice daily as needed for irregular rhythm.

## 2023-07-06 ENCOUNTER — Other Ambulatory Visit: Payer: Self-pay | Admitting: Family Medicine

## 2023-07-06 DIAGNOSIS — L821 Other seborrheic keratosis: Secondary | ICD-10-CM | POA: Diagnosis not present

## 2023-07-06 DIAGNOSIS — K219 Gastro-esophageal reflux disease without esophagitis: Secondary | ICD-10-CM

## 2023-07-06 DIAGNOSIS — L578 Other skin changes due to chronic exposure to nonionizing radiation: Secondary | ICD-10-CM | POA: Diagnosis not present

## 2023-07-06 DIAGNOSIS — L570 Actinic keratosis: Secondary | ICD-10-CM | POA: Diagnosis not present

## 2023-07-08 DIAGNOSIS — M9902 Segmental and somatic dysfunction of thoracic region: Secondary | ICD-10-CM | POA: Diagnosis not present

## 2023-07-08 DIAGNOSIS — M4712 Other spondylosis with myelopathy, cervical region: Secondary | ICD-10-CM | POA: Diagnosis not present

## 2023-07-08 DIAGNOSIS — M9901 Segmental and somatic dysfunction of cervical region: Secondary | ICD-10-CM | POA: Diagnosis not present

## 2023-07-08 DIAGNOSIS — M542 Cervicalgia: Secondary | ICD-10-CM | POA: Diagnosis not present

## 2023-08-16 ENCOUNTER — Other Ambulatory Visit: Payer: Self-pay | Admitting: Family Medicine

## 2023-08-16 DIAGNOSIS — E782 Mixed hyperlipidemia: Secondary | ICD-10-CM

## 2023-08-16 MED ORDER — ROSUVASTATIN CALCIUM 5 MG PO TABS
5.0000 mg | ORAL_TABLET | Freq: Every day | ORAL | 1 refills | Status: DC
Start: 2023-08-16 — End: 2023-10-04

## 2023-08-16 NOTE — Telephone Encounter (Signed)
Copied from CRM [id number]. Topic: Clinical - Medication Refill >> Aug 16, 2023  9:08 AM Fuller Mandril wrote: Most Recent Primary Care Visit:  Provider: [facility]  Department: [facility]  Visit Type: OFFICE VISIT  Date: 06/29/2023  Medication: rosuvastatin (CRESTOR) 5 MG tablet - pt only has 1 left of Crestor, but states she doesn't drive and would like to have any Rx that are able to be ordered sent so she can have everything picked up at once.   Has the patient contacted their pharmacy? Yes - unable to fill  (Agent: If no, request that the patient contact the pharmacy for the refill. If patient does not wish to contact the pharmacy document the reason why and proceed with request.) (Agent: If yes, when and what did the pharmacy advise?)  Is this the correct pharmacy for this prescription? Yes If no, delete pharmacy and type the correct one.  This is the patient's preferred pharmacy:  [facility] [phone] - [place], [place] - [phone] AT [facility] [address] [place] [address] Phone: [phone] Fax: [phone]   Has the prescription been filled recently? No  Is the patient out of the medication? No  - 1 left   Has the patient been seen for an appointment in the last year OR does the patient have an upcoming appointment? Yes  Can we respond through MyChart? No  Agent: Please be advised that Rx refills may take up to 3 business days. We ask that you follow-up with your pharmacy.

## 2023-08-19 DIAGNOSIS — M4712 Other spondylosis with myelopathy, cervical region: Secondary | ICD-10-CM | POA: Diagnosis not present

## 2023-08-19 DIAGNOSIS — M542 Cervicalgia: Secondary | ICD-10-CM | POA: Diagnosis not present

## 2023-08-19 DIAGNOSIS — M9902 Segmental and somatic dysfunction of thoracic region: Secondary | ICD-10-CM | POA: Diagnosis not present

## 2023-08-19 DIAGNOSIS — M9901 Segmental and somatic dysfunction of cervical region: Secondary | ICD-10-CM | POA: Diagnosis not present

## 2023-09-23 DIAGNOSIS — M9902 Segmental and somatic dysfunction of thoracic region: Secondary | ICD-10-CM | POA: Diagnosis not present

## 2023-09-23 DIAGNOSIS — M4712 Other spondylosis with myelopathy, cervical region: Secondary | ICD-10-CM | POA: Diagnosis not present

## 2023-09-23 DIAGNOSIS — M542 Cervicalgia: Secondary | ICD-10-CM | POA: Diagnosis not present

## 2023-09-23 DIAGNOSIS — M9901 Segmental and somatic dysfunction of cervical region: Secondary | ICD-10-CM | POA: Diagnosis not present

## 2023-09-24 DIAGNOSIS — D485 Neoplasm of uncertain behavior of skin: Secondary | ICD-10-CM | POA: Diagnosis not present

## 2023-09-24 DIAGNOSIS — C44321 Squamous cell carcinoma of skin of nose: Secondary | ICD-10-CM | POA: Diagnosis not present

## 2023-09-24 DIAGNOSIS — L570 Actinic keratosis: Secondary | ICD-10-CM | POA: Diagnosis not present

## 2023-10-01 DIAGNOSIS — C44321 Squamous cell carcinoma of skin of nose: Secondary | ICD-10-CM | POA: Diagnosis not present

## 2023-10-03 NOTE — Progress Notes (Unsigned)
Subjective:  Patient ID: Elizabeth Bradley, female    DOB: September 15, 1929  Age: 89 y.o. MRN: [id number]  Chief Complaint  Patient presents with   Medical Management of Chronic Issues    HPI  The patient, with a history of hypertension, hyperlipidemia, hypothyroidism, stroke, and supraventricular tachycardia, presents for follow-up after recent skin cancer surgery. 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 They report no pain but have difficulty breathing due to packing in the nose.  They also report a chronic cough that worsens at night, which they attribute to postnasal drainage. They have tried over-the-counter remedies such as Mucinex and Sinex with variable success. They also mention occasional urinary urgency that sometimes results in incontinence if they do not reach the bathroom in time. They have considered using pull-ups at night, especially if they drink tea before bed.  The patient also reports a persistent shoulder pain, which they believe is due to an old injury from their job. They manage the pain with Celebrex as needed. They deny any feelings of depression or sadness and report no recent falls.  Patient is a 89 year old white female who presents for follow-up of hyperlipidemia, hypothyroidism, hypertension, SVT, and vision loss secondary to stroke years ago.  Hyperlipidemia: On Crestor 5 mg daily. Eats healthy. Tolerating well   Hypothyroidism: Currently on Synthroid 25 mcg once daily.  Hypertension: Currently on amlodipine 2.5 mg once daily.  Has a history of strokes several years ago.   Currently on Plavix 75 mg once daily.   GERD: On Pepcid 20 mg once a day as needed. Takes it about 3-4 times per week. Marland Kitchen  SVT currently will take diltiazem 120 mg once daily.  Shoulder pain left: takes celebrex 100 mg daily as needed.   Had [facility] surgery on nose 10/01/2023.     06/29/2023    4:05 PM 03/29/2023   10:32  AM 03/17/2022    1:39 PM 07/30/2021    9:54 AM 05/30/2021    9:53 AM  Depression screen PHQ 2/9  Decreased Interest 0 0 0 0 0  Down, Depressed, Hopeless 0 0 0 0 0  PHQ - 2 Score 0 0 0 0 0        10/04/2023   10:22 AM  Fall Risk   Falls in the past year? 0  Number falls in past yr: 0  Injury with Fall? 0  Risk for fall due to : No Fall Risks  Follow up Falls evaluation completed    Patient Care Team: Blane Ohara, MD as PCP - General (Family Medicine)   Review of Systems  Constitutional:  Negative for chills, fatigue and fever.  HENT:  Negative for congestion, ear pain, rhinorrhea and sore throat.   Respiratory:  Positive for cough (years. at night.). Negative for shortness of breath.   Cardiovascular:  Negative for chest pain.  Gastrointestinal:  Negative for abdominal pain, constipation, diarrhea, nausea and vomiting.  Genitourinary:  Negative for dysuria and urgency.  Musculoskeletal:  Negative for back pain and myalgias.  Neurological:  Negative for dizziness, weakness, light-headedness and headaches.  Psychiatric/Behavioral:  Negative for dysphoric mood. The patient is not nervous/anxious.     Current Outpatient Medications on File Prior to Visit  Medication Sig Dispense Refill   cetirizine (ZYRTEC) 10 MG chewable tablet Chew 10 mg by mouth daily.     Cholecalciferol (VITAMIN D-3) 25 MCG (1000 UT) CAPS Take by  mouth.     No current facility-administered medications on file prior to visit.   Past Medical History:  Diagnosis Date   Atrophy of thyroid    Cerebrovascular accident (CVA) (HCC)    Essential hypertension    GERD (gastroesophageal reflux disease)    History of skin cancer    Ischemic optic neuropathy of left eye    Low back pain    Mixed hyperlipidemia    Osteoarthritis    Stroke (HCC)    Vitamin D deficiency    Past Surgical History:  Procedure Laterality Date   ABDOMINAL HYSTERECTOMY     SALPINGOOPHORECTOMY     THYROIDECTOMY      Family History   Problem Relation Age of Onset   Hypertension Mother    Heart failure Mother    Heart failure Father    Cancer Sister        LUNG   Heart failure Brother    Hypertension Brother    Cancer Brother    Social History   Socioeconomic History   Marital status: Widowed    Spouse name: Not on file   Number of children: Not on file   Years of education: Not on file   Highest education level: Not on file  Occupational History   Not on file  Tobacco Use   Smoking status: Never   Smokeless tobacco: Never  Substance and Sexual Activity   Alcohol use: Never   Drug use: Never   Sexual activity: Not Currently  Other Topics Concern   Not on file  Social History Narrative   Not on file   Social Drivers of Health   Financial Resource Strain: Low Risk  (03/29/2023)   Overall Financial Resource Strain (CARDIA)    Difficulty of Paying Living Expenses: Not very hard  Food Insecurity: No Food Insecurity (03/29/2023)   Hunger Vital Sign    Worried About Running Out of Food in the Last Year: Never true    Ran Out of Food in the Last Year: Never true  Transportation Needs: No Transportation Needs (03/29/2023)   PRAPARE - Administrator, Civil Service (Medical): No    Lack of Transportation (Non-Medical): No  Physical Activity: Insufficiently Active (03/29/2023)   Exercise Vital Sign    Days of Exercise per Week: 5 days    Minutes of Exercise per Session: 20 min  Stress: No Stress Concern Present (03/29/2023)   [employer] of Occupational Health - Occupational Stress Questionnaire    Feeling of Stress : Only a little  Social Connections: Moderately Integrated (03/29/2023)   Social Connection and Isolation Panel [NHANES]    Frequency of Communication with Friends and Family: More than three times a week    Frequency of Social Gatherings with Friends and Family: More than three times a week    Attends Religious Services: More than 4 times per year    Active Member of [employer] or  Organizations: Yes    Attends Banker Meetings: More than 4 times per year    Marital Status: Widowed    Objective:  BP 128/64   Pulse 90   Temp 97.8 F (36.6 C)   Ht 4\' 11"  (1.499 m)   Wt 122 lb (55.3 kg)   SpO2 98%   BMI 24.64 kg/m      10/04/2023   10:16 AM 06/29/2023    4:40 PM 06/29/2023    4:00 PM  BP/Weight  Systolic BP 128 142 146  Diastolic BP  64 60 60  Wt. (Lbs) 122  128.4  BMI 24.64 kg/m2  25.93 kg/m2    Physical Exam Vitals reviewed.  Constitutional:      Appearance: Normal appearance. She is normal weight.  Neck:     Vascular: No carotid bruit.  Cardiovascular:     Rate and Rhythm: Normal rate and regular rhythm.     Heart sounds: Normal heart sounds.  Pulmonary:     Effort: Pulmonary effort is normal. No respiratory distress.     Breath sounds: Normal breath sounds.  Abdominal:     General: Abdomen is flat. Bowel sounds are normal.     Palpations: Abdomen is soft.     Tenderness: There is no abdominal tenderness.  Skin:    Comments: Face: bandaged. s/p Moh's surgery.   Neurological:     Mental Status: She is alert and oriented to person, place, and time.  Psychiatric:        Mood and Affect: Mood normal.        Behavior: Behavior normal.     Diabetic Foot Exam - Simple   No data filed      Lab Results  Component Value Date   WBC 5.0 10/04/2023   HGB 12.4 10/04/2023   HCT 37.2 10/04/2023   PLT 230 10/04/2023   GLUCOSE 94 10/04/2023   CHOL 190 10/04/2023   TRIG 95 10/04/2023   HDL 93 10/04/2023   LDLCALC 80 10/04/2023   ALT 14 10/04/2023   AST 20 10/04/2023   NA 143 10/04/2023   K 4.0 10/04/2023   CL 108 (H) 10/04/2023   CREATININE 1.20 (H) 10/04/2023   BUN 18 10/04/2023   CO2 20 10/04/2023   TSH 2.630 10/04/2023      Assessment & Plan:    Essential hypertension Assessment & Plan: Well controlled.  No changes to medicines. Continue amlodipine 2.5 mg daily. Continue to work on eating a healthy diet and  exercise.  Labs drawn today.     Orders: -     CBC with Differential/Platelet -     Comprehensive metabolic panel -     amLODIPine Besylate; Take 1 tablet (2.5 mg total) by mouth daily.  Dispense: 90 tablet; Refill: 3  Mixed hyperlipidemia Assessment & Plan: The current medical regimen is effective;  continue present plan and medications. Continue crestor 5 mg daily Check labs.  Orders: -     Lipid panel -     TSH -     Rosuvastatin Calcium; Take 1 tablet (5 mg total) by mouth daily.  Dispense: 90 tablet; Refill: 1  Atherosclerosis of aorta (HCC) Assessment & Plan: Continue crestor 5 mg daily and plavix 75 mg once daily.  Orders: -     Clopidogrel Bisulfate; Take 1 tablet (75 mg total) by mouth daily.  Dispense: 90 tablet; Refill: 3  Gastroesophageal reflux disease without esophagitis Assessment & Plan: The current medical regimen is effective;  continue present plan and medications.  Pepcid 20 mg once a day as needed.  Orders: -     Famotidine; Take 1 tablet (20 mg total) by mouth daily.  Dispense: 90 tablet; Refill: 3  PSVT (paroxysmal supraventricular tachycardia) (HCC) Assessment & Plan: The current medical regimen is effective;  continue present plan and medications. Continue diltiazem 120 mg  Daily.    Orders: -     dilTIAZem HCl ER Coated Beads; Take 1 capsule (120 mg total) by mouth daily.  Dispense: 90 capsule; Refill: 3 -  dilTIAZem HCl; Take 1 tablet (30 mg total) by mouth 2 (two) times daily as needed (palpitations).  Dispense: 60 tablet; Refill: 0  Degeneration of lumbar intervertebral disc -     Celecoxib; Take 1 capsule (100 mg total) by mouth daily.  Dispense: 90 capsule; Refill: 1  Non-seasonal allergic rhinitis due to pollen Assessment & Plan: Continue flonase.  Patient called back to see if she could return for a steroid shot.  I discussed with dermatology to ensure safety since had recent surgery.  Orders: -     Fluticasone Propionate;  Place 2 sprays into both nostrils daily.  Dispense: 48 g; Refill: 3  Secondary hypothyroidism -     Levothyroxine Sodium; Take 1 tablet (25 mcg total) by mouth daily.  Dispense: 90 tablet; Refill: 3  Vision disturbance following CVA (cerebrovascular accident) Assessment & Plan: Continue with Plavix and crestor.      Meds ordered this encounter  Medications   amLODipine (NORVASC) 2.5 MG tablet    Sig: Take 1 tablet (2.5 mg total) by mouth daily.    Dispense:  90 tablet    Refill:  3   celecoxib (CELEBREX) 100 MG capsule    Sig: Take 1 capsule (100 mg total) by mouth daily.    Dispense:  90 capsule    Refill:  1   clopidogrel (PLAVIX) 75 MG tablet    Sig: Take 1 tablet (75 mg total) by mouth daily.    Dispense:  90 tablet    Refill:  3   diltiazem (CARDIZEM CD) 120 MG 24 hr capsule    Sig: Take 1 capsule (120 mg total) by mouth daily.    Dispense:  90 capsule    Refill:  3   diltiazem (CARDIZEM) 30 MG tablet    Sig: Take 1 tablet (30 mg total) by mouth 2 (two) times daily as needed (palpitations).    Dispense:  60 tablet    Refill:  0   famotidine (PEPCID) 20 MG tablet    Sig: Take 1 tablet (20 mg total) by mouth daily.    Dispense:  90 tablet    Refill:  3   fluticasone (FLONASE) 50 MCG/ACT nasal spray    Sig: Place 2 sprays into both nostrils daily.    Dispense:  48 g    Refill:  3    90 day prescription.   levothyroxine (SYNTHROID) 25 MCG tablet    Sig: Take 1 tablet (25 mcg total) by mouth daily.    Dispense:  90 tablet    Refill:  3    This prescription was filled on 05/19/2022. Any refills authorized will be placed on file.   rosuvastatin (CRESTOR) 5 MG tablet    Sig: Take 1 tablet (5 mg total) by mouth daily.    Dispense:  90 tablet    Refill:  1    Orders Placed This Encounter  Procedures   CBC with Differential/Platelet   Comprehensive metabolic panel   Lipid panel   TSH     Follow-up: Return in about 6 months (around 04/02/2024) for awv, chronic  follow up.   I,Marla I Leal-Borjas,acting as a scribe for Blane Ohara, MD.,have documented all relevant documentation on the behalf of Blane Ohara, MD,as directed by  Blane Ohara, MD while in the presence of Blane Ohara, MD.   An After Visit Summary was printed and given to the patient.  I attest that I have reviewed this visit and agree with the plan scribed by  my staff.   Blane Ohara, MD [facility] [phone]

## 2023-10-04 ENCOUNTER — Ambulatory Visit (INDEPENDENT_AMBULATORY_CARE_PROVIDER_SITE_OTHER): Payer: PPO | Admitting: Family Medicine

## 2023-10-04 ENCOUNTER — Telehealth: Payer: Self-pay

## 2023-10-04 ENCOUNTER — Other Ambulatory Visit: Payer: Self-pay | Admitting: Family Medicine

## 2023-10-04 ENCOUNTER — Encounter: Payer: Self-pay | Admitting: Family Medicine

## 2023-10-04 VITALS — BP 128/64 | HR 90 | Temp 97.8°F | Ht 59.0 in | Wt 122.0 lb

## 2023-10-04 DIAGNOSIS — I471 Supraventricular tachycardia, unspecified: Secondary | ICD-10-CM | POA: Diagnosis not present

## 2023-10-04 DIAGNOSIS — I10 Essential (primary) hypertension: Secondary | ICD-10-CM | POA: Diagnosis not present

## 2023-10-04 DIAGNOSIS — E038 Other specified hypothyroidism: Secondary | ICD-10-CM | POA: Diagnosis not present

## 2023-10-04 DIAGNOSIS — K219 Gastro-esophageal reflux disease without esophagitis: Secondary | ICD-10-CM

## 2023-10-04 DIAGNOSIS — G8929 Other chronic pain: Secondary | ICD-10-CM

## 2023-10-04 DIAGNOSIS — M51369 Other intervertebral disc degeneration, lumbar region without mention of lumbar back pain or lower extremity pain: Secondary | ICD-10-CM

## 2023-10-04 DIAGNOSIS — I69398 Other sequelae of cerebral infarction: Secondary | ICD-10-CM | POA: Diagnosis not present

## 2023-10-04 DIAGNOSIS — E782 Mixed hyperlipidemia: Secondary | ICD-10-CM

## 2023-10-04 DIAGNOSIS — I700 Atherosclerosis of aorta: Secondary | ICD-10-CM

## 2023-10-04 DIAGNOSIS — M25512 Pain in left shoulder: Secondary | ICD-10-CM | POA: Diagnosis not present

## 2023-10-04 DIAGNOSIS — H539 Unspecified visual disturbance: Secondary | ICD-10-CM | POA: Diagnosis not present

## 2023-10-04 DIAGNOSIS — L570 Actinic keratosis: Secondary | ICD-10-CM | POA: Diagnosis not present

## 2023-10-04 DIAGNOSIS — J301 Allergic rhinitis due to pollen: Secondary | ICD-10-CM

## 2023-10-04 MED ORDER — FAMOTIDINE 20 MG PO TABS
20.0000 mg | ORAL_TABLET | Freq: Every day | ORAL | 3 refills | Status: AC
  Filled 2023-12-15: qty 90, 90d supply, fill #0

## 2023-10-04 MED ORDER — CLOPIDOGREL BISULFATE 75 MG PO TABS
75.0000 mg | ORAL_TABLET | Freq: Every day | ORAL | 2 refills | Status: DC
  Filled 2023-12-15: qty 90, 90d supply, fill #0

## 2023-10-04 MED ORDER — ROSUVASTATIN CALCIUM 5 MG PO TABS
5.0000 mg | ORAL_TABLET | Freq: Every day | ORAL | 1 refills | Status: DC
  Filled 2023-12-15: qty 90, 90d supply, fill #0

## 2023-10-04 MED ORDER — AMLODIPINE BESYLATE 2.5 MG PO TABS
2.5000 mg | ORAL_TABLET | Freq: Every day | ORAL | 0 refills | Status: DC
  Filled 2023-12-15: qty 90, 90d supply, fill #0

## 2023-10-04 MED ORDER — DILTIAZEM HCL ER COATED BEADS 120 MG PO CP24: 120.0000 mg | ORAL_CAPSULE | Freq: Every day | ORAL | 3 refills | Status: DC

## 2023-10-04 MED ORDER — CELECOXIB 100 MG PO CAPS
100.0000 mg | ORAL_CAPSULE | Freq: Every day | ORAL | 1 refills | Status: DC
  Filled 2023-12-15: qty 90, 90d supply, fill #0

## 2023-10-04 MED ORDER — DILTIAZEM HCL ER 120 MG PO CP24
120.0000 mg | ORAL_CAPSULE | Freq: Every day | ORAL | 3 refills | Status: DC
  Filled 2023-12-15: qty 90, 90d supply, fill #0

## 2023-10-04 MED ORDER — FLUTICASONE PROPIONATE 50 MCG/ACT NA SUSP
2.0000 | Freq: Every day | NASAL | 3 refills | Status: DC
  Filled 2023-12-15: qty 48, 90d supply, fill #0

## 2023-10-04 MED ORDER — LEVOTHYROXINE SODIUM 25 MCG PO TABS
25.0000 ug | ORAL_TABLET | Freq: Every day | ORAL | 2 refills | Status: DC
  Filled 2023-12-15: qty 90, 90d supply, fill #0

## 2023-10-04 NOTE — Assessment & Plan Note (Signed)
The current medical regimen is effective;  continue present plan and medications. Continue diltiazem 120 mg  Daily.

## 2023-10-04 NOTE — Assessment & Plan Note (Signed)
The current medical regimen is effective;  continue present plan and medications. Continue crestor 5 mg daily Check labs.

## 2023-10-04 NOTE — Assessment & Plan Note (Signed)
Continue crestor 5 mg daily and plavix 75 mg once daily.

## 2023-10-04 NOTE — Assessment & Plan Note (Signed)
The current medical regimen is effective;  continue present plan and medications.  Pepcid 20 mg once a day as needed.

## 2023-10-04 NOTE — Assessment & Plan Note (Signed)
Well controlled.  No changes to medicines. Continue amlodipine 2.5 mg daily. Continue to work on eating a healthy diet and exercise.  Labs drawn today.

## 2023-10-04 NOTE — Telephone Encounter (Signed)
Patient called and stated she wanted to know if she can get a kenalog shot for her allergies, she forgot to mention to provider while she was here.  Called patient left message provider recommends patient not have any steroids at this time, due to her just having MOHs surgery, she would recommend no steroids until her nose heals.

## 2023-10-05 LAB — LIPID PANEL
Chol/HDL Ratio: 2 {ratio} (ref 0.0–4.4)
Cholesterol, Total: 190 mg/dL (ref 100–199)
HDL: 93 mg/dL (ref >39)
LDL Chol Calc (NIH): 80 mg/dL (ref 0–99)
Triglycerides: 95 mg/dL (ref 0–149)
VLDL Cholesterol Cal: 17 mg/dL (ref 5–40)

## 2023-10-05 LAB — COMPREHENSIVE METABOLIC PANEL
ALT: 14 [IU]/L (ref 0–32)
AST: 20 [IU]/L (ref 0–40)
Albumin: 4.3 g/dL (ref 3.6–4.6)
Alkaline Phosphatase: 78 [IU]/L (ref 44–121)
BUN/Creatinine Ratio: 15 (ref 12–28)
BUN: 18 mg/dL (ref 10–36)
Bilirubin Total: 0.5 mg/dL (ref 0.0–1.2)
CO2: 20 mmol/L (ref 20–29)
Calcium: 9.5 mg/dL (ref 8.7–10.3)
Chloride: 108 mmol/L — ABNORMAL HIGH (ref 96–106)
Creatinine, Ser: 1.2 mg/dL — ABNORMAL HIGH (ref 0.57–1.00)
Globulin, Total: 2.3 g/dL (ref 1.5–4.5)
Glucose: 94 mg/dL (ref 70–99)
Potassium: 4 mmol/L (ref 3.5–5.2)
Sodium: 143 mmol/L (ref 134–144)
Total Protein: 6.6 g/dL (ref 6.0–8.5)
eGFR: 43 mL/min/{1.73_m2} — ABNORMAL LOW (ref >59)

## 2023-10-05 LAB — CBC WITH DIFFERENTIAL/PLATELET
Basophils Absolute: 0.1 10*3/uL (ref 0.0–0.2)
Basos: 1 %
EOS (ABSOLUTE): 0.4 10*3/uL (ref 0.0–0.4)
Eos: 9 %
Hematocrit: 37.2 % (ref 34.0–46.6)
Hemoglobin: 12.4 g/dL (ref 11.1–15.9)
Immature Grans (Abs): 0 10*3/uL (ref 0.0–0.1)
Immature Granulocytes: 0 %
Lymphocytes Absolute: 1.8 10*3/uL (ref 0.7–3.1)
Lymphs: 35 %
MCH: 30.5 pg (ref 26.6–33.0)
MCHC: 33.3 g/dL (ref 31.5–35.7)
MCV: 91 fL (ref 79–97)
Monocytes Absolute: 0.6 10*3/uL (ref 0.1–0.9)
Monocytes: 13 %
Neutrophils Absolute: 2.1 10*3/uL (ref 1.4–7.0)
Neutrophils: 42 %
Platelets: 230 10*3/uL (ref 150–450)
RBC: 4.07 x10E6/uL (ref 3.77–5.28)
RDW: 12.5 % (ref 11.7–15.4)
WBC: 5 10*3/uL (ref 3.4–10.8)

## 2023-10-05 LAB — TSH: TSH: 2.63 u[IU]/mL (ref 0.450–4.500)

## 2023-10-06 DIAGNOSIS — J301 Allergic rhinitis due to pollen: Secondary | ICD-10-CM | POA: Insufficient documentation

## 2023-10-06 NOTE — Assessment & Plan Note (Signed)
Continue with Plavix and crestor.

## 2023-10-06 NOTE — Telephone Encounter (Signed)
I discussed with dermatology and her wound from her MOH's is healing well. Dr. Mayford Knife had no issue with a kenalog shot.  I will have patient called and she can return for kenalog shot.  Dr. Sedalia Muta

## 2023-10-06 NOTE — Assessment & Plan Note (Signed)
Continue flonase.  Patient called back to see if she could return for a steroid shot.  I discussed with dermatology to ensure safety since had recent surgery.

## 2023-10-07 NOTE — Telephone Encounter (Signed)
Called made patient aware, Patient stated she does not have a ride but she will call back and make a nurse visit one day next week

## 2023-10-30 DIAGNOSIS — C44321 Squamous cell carcinoma of skin of nose: Secondary | ICD-10-CM | POA: Diagnosis not present

## 2023-10-30 DIAGNOSIS — L570 Actinic keratosis: Secondary | ICD-10-CM | POA: Diagnosis not present

## 2023-10-30 DIAGNOSIS — L853 Xerosis cutis: Secondary | ICD-10-CM | POA: Diagnosis not present

## 2023-11-23 DIAGNOSIS — R002 Palpitations: Secondary | ICD-10-CM | POA: Diagnosis not present

## 2023-11-23 DIAGNOSIS — I471 Supraventricular tachycardia, unspecified: Secondary | ICD-10-CM | POA: Diagnosis not present

## 2023-11-23 DIAGNOSIS — R0989 Other specified symptoms and signs involving the circulatory and respiratory systems: Secondary | ICD-10-CM | POA: Diagnosis not present

## 2023-11-23 DIAGNOSIS — I499 Cardiac arrhythmia, unspecified: Secondary | ICD-10-CM | POA: Diagnosis not present

## 2023-11-23 DIAGNOSIS — E782 Mixed hyperlipidemia: Secondary | ICD-10-CM | POA: Diagnosis not present

## 2023-11-23 DIAGNOSIS — I10 Essential (primary) hypertension: Secondary | ICD-10-CM | POA: Diagnosis not present

## 2023-11-30 DIAGNOSIS — M9901 Segmental and somatic dysfunction of cervical region: Secondary | ICD-10-CM | POA: Diagnosis not present

## 2023-11-30 DIAGNOSIS — M9902 Segmental and somatic dysfunction of thoracic region: Secondary | ICD-10-CM | POA: Diagnosis not present

## 2023-11-30 DIAGNOSIS — M9903 Segmental and somatic dysfunction of lumbar region: Secondary | ICD-10-CM | POA: Diagnosis not present

## 2023-11-30 DIAGNOSIS — M9904 Segmental and somatic dysfunction of sacral region: Secondary | ICD-10-CM | POA: Diagnosis not present

## 2023-12-14 ENCOUNTER — Telehealth: Payer: Self-pay

## 2023-12-14 ENCOUNTER — Other Ambulatory Visit (HOSPITAL_COMMUNITY): Payer: Self-pay

## 2023-12-14 ENCOUNTER — Other Ambulatory Visit: Payer: Self-pay

## 2023-12-14 NOTE — Telephone Encounter (Signed)
 Patient stated if she has her rx's filled at [facility] it cost her $300 a month and questioned where she could have them filled cheaper.  Patient advised to call her insurance to see who their preferred pharmacy is and if it would be cheaper to use mail order. Patient verbalized understanding.  Copied from CRM [id number]. Topic: General - Call Back - No Documentation >> Dec 14, 2023 11:34 AM Fonda Kinder J wrote: Reason for CRM: Pt states she would like to speak to Dr. Renea Ee nurse regarding her medication. She did not want to disclose details or which medication with me but she requested to receive a callback

## 2023-12-15 ENCOUNTER — Other Ambulatory Visit (HOSPITAL_COMMUNITY): Payer: Self-pay

## 2023-12-15 ENCOUNTER — Other Ambulatory Visit: Payer: Self-pay

## 2023-12-15 MED ORDER — AMLODIPINE BESYLATE 2.5 MG PO TABS
2.5000 mg | ORAL_TABLET | Freq: Every day | ORAL | 2 refills | Status: DC
  Filled 2023-12-15: qty 90, 90d supply, fill #0

## 2023-12-15 MED ORDER — ROSUVASTATIN CALCIUM 5 MG PO TABS
5.0000 mg | ORAL_TABLET | Freq: Every day | ORAL | 0 refills | Status: DC
  Filled 2023-12-15: qty 90, 90d supply, fill #0

## 2024-01-06 ENCOUNTER — Other Ambulatory Visit: Payer: Self-pay

## 2024-01-06 DIAGNOSIS — I700 Atherosclerosis of aorta: Secondary | ICD-10-CM

## 2024-01-06 DIAGNOSIS — G8929 Other chronic pain: Secondary | ICD-10-CM

## 2024-01-06 DIAGNOSIS — E038 Other specified hypothyroidism: Secondary | ICD-10-CM

## 2024-01-06 MED ORDER — AMLODIPINE BESYLATE 2.5 MG PO TABS: 2.5000 mg | ORAL_TABLET | Freq: Every day | ORAL | 2 refills | Status: DC

## 2024-01-06 MED ORDER — CELECOXIB 100 MG PO CAPS: 100.0000 mg | ORAL_CAPSULE | Freq: Every day | ORAL | 1 refills | Status: DC

## 2024-01-06 MED ORDER — LEVOTHYROXINE SODIUM 25 MCG PO TABS: 25.0000 ug | ORAL_TABLET | Freq: Every day | ORAL | 2 refills | Status: DC

## 2024-01-11 ENCOUNTER — Other Ambulatory Visit: Payer: Self-pay

## 2024-01-11 ENCOUNTER — Other Ambulatory Visit: Payer: Self-pay | Admitting: Family Medicine

## 2024-01-11 DIAGNOSIS — E782 Mixed hyperlipidemia: Secondary | ICD-10-CM

## 2024-01-11 MED ORDER — ROSUVASTATIN CALCIUM 5 MG PO TABS: 5.0000 mg | ORAL_TABLET | Freq: Every day | ORAL | 1 refills | Status: DC

## 2024-01-11 NOTE — Telephone Encounter (Signed)
 Per chart, crestor  was sent to pharm today.

## 2024-01-11 NOTE — Telephone Encounter (Signed)
 Has been sent to pharmacy.  Copied from CRM [id number]. Topic: Clinical - Medication Refill >> Jan 11, 2024  9:36 AM Artemio Larry wrote: Most Recent Primary Care Visit:  Provider: [facility], [facility]  Department: [facility]  Visit Type: OFFICE VISIT  Date: 10/04/2023  Medication: rosuvastatin    Has the patient contacted their pharmacy? Yes (Agent: If no, request that the patient contact the pharmacy for the refill. If patient does not wish to contact the pharmacy document the reason why and proceed with request.) (Agent: If yes, when and what did the pharmacy advise?)  Is this the correct pharmacy for this prescription? Yes If no, delete pharmacy and type the correct one.  This is the patient's preferred pharmacy:    [facility] [address] - [place], [place] - [address] [place] [address] Phone: [phone] Fax: [phone]   Has the prescription been filled recently? Yes  Is the patient out of the medication? Yes  Has the patient been seen for an appointment in the last year OR does the patient have an upcoming appointment? Yes  Can we respond through MyChart? No  Agent: Please be advised that Rx refills may take up to 3 business days. We ask that you follow-up with your pharmacy. >> Jan 11, 2024  3:20 PM Felizardo Hotter wrote: Pt called regarding rosuvastatin  (CRESTOR ) 5 MG tablet stated insurance will not cover medication until July. Pt is out of medication. Pt states she is legally blind and requires Dr. Diann Forth assistance. Please call pt at [phone]

## 2024-01-11 NOTE — Telephone Encounter (Signed)
 Copied from CRM [id number]. Topic: Clinical - Medication Refill >> Jan 11, 2024  4:26 PM Rosaria Common wrote: Most Recent Primary Care Visit:  Provider: [facility]  Department: [facility]  Visit Type: OFFICE VISIT  Date: 10/04/2023  Medication: rosuvastatin  (CRESTOR ) 5 MG tablet  Has the patient contacted their pharmacy? Yes (Agent: If no, request that the patient contact the pharmacy for the refill. If patient does not wish to contact the pharmacy document the reason why and proceed with request.) (Agent: If yes, when and what did the pharmacy advise?)  Is this the correct pharmacy for this prescription? Yes If no, delete pharmacy and type the correct one.  This is the patient's preferred pharmacy:    [facility] [address] - [place], [place] - [address] [place] [address] Phone: [phone] Fax: [phone]   Has the prescription been filled recently? Yes  Is the patient out of the medication? Yes  Has the patient been seen for an appointment in the last year OR does the patient have an upcoming appointment? Yes  Can we respond through MyChart? No  Agent: Please be advised that Rx refills may take up to 3 business days. We ask that you follow-up with your pharmacy.

## 2024-01-11 NOTE — Telephone Encounter (Signed)
 Copied from CRM [id number]. Topic: Clinical - Medication Refill >> Jan 11, 2024  9:36 AM Artemio Larry wrote: Most Recent Primary Care Visit:  Provider: [facility]  [facility]: [facility]  Visit Type: OFFICE VISIT  Date: 10/04/2023  Medication: rosuvastatin    Has the patient contacted their pharmacy? Yes (Agent: If no, request that the patient contact the pharmacy for the refill. If patient does not wish to contact the pharmacy document the reason why and proceed with request.) (Agent: If yes, when and what did the pharmacy advise?)  Is this the correct pharmacy for this prescription? Yes If no, delete pharmacy and type the correct one.  This is the patient's preferred pharmacy:    [facility] [address] - [place], [place] - [address] [place] [address] Phone: [phone] Fax: [phone]   Has the prescription been filled recently? Yes  Is the patient out of the medication? Yes  Has the patient been seen for an appointment in the last year OR does the patient have an upcoming appointment? Yes  Can we respond through MyChart? No  Agent: Please be advised that Rx refills may take up to 3 business days. We ask that you follow-up with your pharmacy.

## 2024-01-15 DIAGNOSIS — C44321 Squamous cell carcinoma of skin of nose: Secondary | ICD-10-CM | POA: Diagnosis not present

## 2024-01-15 DIAGNOSIS — L821 Other seborrheic keratosis: Secondary | ICD-10-CM | POA: Diagnosis not present

## 2024-01-15 DIAGNOSIS — L570 Actinic keratosis: Secondary | ICD-10-CM | POA: Diagnosis not present

## 2024-01-27 ENCOUNTER — Telehealth: Payer: Self-pay

## 2024-01-27 DIAGNOSIS — I700 Atherosclerosis of aorta: Secondary | ICD-10-CM

## 2024-01-27 MED ORDER — CLOPIDOGREL BISULFATE 75 MG PO TABS: 75.0000 mg | ORAL_TABLET | Freq: Every day | ORAL | 2 refills | Status: DC

## 2024-01-27 NOTE — Telephone Encounter (Signed)
 Patient called in and requested refill on her Plavix . Refill has been sent to [facility] on [address]

## 2024-02-07 ENCOUNTER — Other Ambulatory Visit: Payer: Self-pay | Admitting: Family Medicine

## 2024-02-07 ENCOUNTER — Telehealth: Payer: Self-pay

## 2024-02-07 MED ORDER — BENZONATATE 200 MG PO CAPS: 200.0000 mg | ORAL_CAPSULE | Freq: Three times a day (TID) | ORAL | 0 refills | Status: DC | PRN

## 2024-02-07 NOTE — Telephone Encounter (Signed)
 Patient was informed. Dr Reinhold Carbine will send tessalon pearl. She will call us  back if it is getting worse.

## 2024-02-07 NOTE — Telephone Encounter (Signed)
 Spoke with patient she states she is coughing bad at night time, dry, thick green mucus. She states a doctor she saw before stated if it is green it comes from the lungs and if yellow it comes from the nasal. She states she can't swallow her Mucinex sometimes, and this has been going on for about a year and is getting worse. She states if she is outside in the wind she feels like it closes up more at night and states she has wild [place] at home she will try that as well. She states is there anything you can send her in to help with the cough and to help break up the mucus.   Copied from CRM [id number]. Topic: General - Other >> Feb 07, 2024  1:56 PM [place] B wrote: Reason for CRM: pt called in to see if Dr.[facility] or nurse can give her a call . Declined to tell the reason why

## 2024-02-08 ENCOUNTER — Other Ambulatory Visit: Payer: Self-pay | Admitting: Family Medicine

## 2024-02-08 NOTE — Telephone Encounter (Signed)
 Copied from CRM [id number]. Topic: Clinical - Medication Refill >> Feb 08, 2024  8:27 AM Everette C wrote: Medication: benzonatate (TESSALON) 200 MG capsule [[id number]]  Has the patient contacted their pharmacy? Yes (Agent: If no, request that the patient contact the pharmacy for the refill. If patient does not wish to contact the pharmacy document the reason why and proceed with request.) (Agent: If yes, when and what did the pharmacy advise?)  This is the patient's preferred pharmacy:  [facility] [address] - [place], [place] - [address] [place] [address] Phone: [phone] Fax: [phone]  Is this the correct pharmacy for this prescription? Yes If no, delete pharmacy and type the correct one.   Has the prescription been filled recently? Yes  Is the patient out of the medication? Yes  Has the patient been seen for an appointment in the last year OR does the patient have an upcoming appointment? Yes  Can we respond through MyChart? Yes  Agent: Please be advised that Rx refills may take up to 3 business days. We ask that you follow-up with your pharmacy.

## 2024-02-09 MED ORDER — BENZONATATE 200 MG PO CAPS: 200.0000 mg | ORAL_CAPSULE | Freq: Three times a day (TID) | ORAL | 0 refills | Status: AC | PRN

## 2024-03-07 DIAGNOSIS — M9904 Segmental and somatic dysfunction of sacral region: Secondary | ICD-10-CM | POA: Diagnosis not present

## 2024-03-07 DIAGNOSIS — M9901 Segmental and somatic dysfunction of cervical region: Secondary | ICD-10-CM | POA: Diagnosis not present

## 2024-03-07 DIAGNOSIS — M9903 Segmental and somatic dysfunction of lumbar region: Secondary | ICD-10-CM | POA: Diagnosis not present

## 2024-03-07 DIAGNOSIS — M9902 Segmental and somatic dysfunction of thoracic region: Secondary | ICD-10-CM | POA: Diagnosis not present

## 2024-03-15 ENCOUNTER — Telehealth: Payer: Self-pay

## 2024-03-15 NOTE — Telephone Encounter (Signed)
 Copied from CRM [id number]. Topic: Clinical - Pink Word Triage >> Mar 15, 2024  9:49 AM Tobias L wrote: Reason for Triage: Patient states she has a sore spot under right shoulder unable to lean back on the sore spot. Patient declined to speak to NT and states she will call back if pain worsens.   FYI

## 2024-03-25 DIAGNOSIS — L814 Other melanin hyperpigmentation: Secondary | ICD-10-CM | POA: Diagnosis not present

## 2024-03-25 DIAGNOSIS — L821 Other seborrheic keratosis: Secondary | ICD-10-CM | POA: Diagnosis not present

## 2024-03-25 DIAGNOSIS — L570 Actinic keratosis: Secondary | ICD-10-CM | POA: Diagnosis not present

## 2024-04-04 ENCOUNTER — Encounter: Payer: Self-pay | Admitting: Family Medicine

## 2024-04-04 ENCOUNTER — Ambulatory Visit (INDEPENDENT_AMBULATORY_CARE_PROVIDER_SITE_OTHER): Payer: PPO | Admitting: Family Medicine

## 2024-04-04 VITALS — BP 136/62 | HR 79 | Temp 98.0°F | Ht 59.0 in | Wt 123.0 lb

## 2024-04-04 DIAGNOSIS — E782 Mixed hyperlipidemia: Secondary | ICD-10-CM | POA: Diagnosis not present

## 2024-04-04 DIAGNOSIS — I471 Supraventricular tachycardia, unspecified: Secondary | ICD-10-CM | POA: Diagnosis not present

## 2024-04-04 DIAGNOSIS — I10 Essential (primary) hypertension: Secondary | ICD-10-CM

## 2024-04-04 DIAGNOSIS — I700 Atherosclerosis of aorta: Secondary | ICD-10-CM

## 2024-04-04 DIAGNOSIS — R1319 Other dysphagia: Secondary | ICD-10-CM | POA: Diagnosis not present

## 2024-04-04 DIAGNOSIS — K219 Gastro-esophageal reflux disease without esophagitis: Secondary | ICD-10-CM | POA: Diagnosis not present

## 2024-04-04 DIAGNOSIS — E038 Other specified hypothyroidism: Secondary | ICD-10-CM

## 2024-04-04 MED ORDER — DILTIAZEM HCL 30 MG PO TABS: 30.0000 mg | ORAL_TABLET | Freq: Two times a day (BID) | ORAL | 0 refills | Status: DC | PRN

## 2024-04-04 NOTE — Assessment & Plan Note (Signed)
 Previously well controlled Continue Synthroid at current dose

## 2024-04-04 NOTE — Progress Notes (Unsigned)
 Subjective:  Patient ID: Elizabeth Bradley, female    DOB: 10/19/1929  Age: 89 y.o. MRN: [id number]  Chief Complaint  Patient presents with   Medical Management of Chronic Issues    HPI:  Patient is a 89 year old white female who presents for follow-up of hyperlipidemia, hypothyroidism, hypertension, SVT, and vision loss secondary to stroke years ago.  Hyperlipidemia: On Crestor  5 mg daily. Eats healthy. Tolerating well   Hypothyroidism: Currently on Synthroid  25 mcg once daily.  Hypertension: Currently on amlodipine  2.5 mg once daily.  Has a history of strokes several years ago.   Currently on Plavix  75 mg once daily.    GERD: On Pepcid  20 mg once a day as needed. Takes it about 3-4 times per week. SABRA  SVT currently will take diltiazem  120 mg once daily.   Shoulder pain left: takes celebrex  100 mg daily as needed.    Had [facility] surgery on nose 10/01/2023.  Discussed the use of AI scribe software for clinical note transcription with the patient, who gave verbal consent to proceed.  History of Present Illness   Elizabeth Bradley is a 89 year old female who presents for medication management and follow-up.  Medication management - Currently taking Crestor  for hyperlipidemia, amlodipine  for hypertension, Plavix  for secondary stroke prevention, and thyroid  medication post-thyroidectomy. - Uses reflux medication as needed. - Requires refills for Celebrex . - Recently switched pharmacy from [facility] to [facility].  Oropharyngeal dysphagia - Difficulty swallowing large pills such as Mucinex; prefers smaller pill options and is not comfortable with liquid medications. - Difficulty swallowing when sitting down to eat, which improves when standing. - Desires repeat esophageal dilation, as swallowing is easier when standing.  Edema and neck sensation - Occasional swelling in the feet. - Sensation of swelling in the area of prior thyroidectomy, questioning if it may be a lymph  node.  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 - Discontinued use of Flonase  nasal spray since nasal surgery. - Occasional sinus issues. - Cough with thick mucus. - No earaches, sore throat, or stuffy nose.  Constitutional and systemic symptoms - No fevers, chills, or sweats. - Good appetite. - Increased water intake to address mild kidney dysfunction noted in prior laboratory results.          04/04/2024   10:12 AM 06/29/2023    4:05 PM 03/29/2023   10:32 AM 03/17/2022    1:39 PM 07/30/2021    9:54 AM  Depression screen PHQ 2/9  Decreased Interest 0 0 0 0 0  Down, Depressed, Hopeless 0 0 0 0 0  PHQ - 2 Score 0 0 0 0 0        04/04/2024   10:12 AM  Fall Risk   Falls in the past year? 0  Number falls in past yr: 0  Injury with Fall? 0  Risk for fall due to : No Fall Risks  Follow up Falls evaluation completed    Patient Care Team: Sherre Clapper, MD as PCP - General (Family Medicine)   Review of Systems  Constitutional:  Negative for chills, fatigue and fever.  HENT:  Negative for congestion, ear pain, rhinorrhea and sore throat.   Respiratory:  Negative for cough and shortness of breath.   Cardiovascular:  Negative for chest pain.  Gastrointestinal:  Negative for abdominal pain, constipation, diarrhea, nausea and vomiting.  Genitourinary:  Negative for dysuria and urgency.  Musculoskeletal:  Negative for back pain and myalgias.  Neurological:  Negative for dizziness, weakness, light-headedness and headaches.  Psychiatric/Behavioral:  Negative for dysphoric mood. The patient is not nervous/anxious.     Current Outpatient Medications on File Prior to Visit  Medication Sig Dispense Refill   amLODipine  (NORVASC ) 2.5 MG tablet Take 1 tablet (2.5 mg total) by mouth daily. 90 tablet 2   benzonatate  (TESSALON ) 200 MG capsule Take 1 capsule (200 mg total) by mouth 3 (three) times daily as needed for cough. 60 capsule 0   celecoxib   (CELEBREX ) 100 MG capsule Take 1 capsule (100 mg total) by mouth daily. 90 capsule 1   cetirizine (ZYRTEC) 10 MG chewable tablet Chew 10 mg by mouth daily.     Cholecalciferol (VITAMIN D-3) 25 MCG (1000 UT) CAPS Take by mouth.     clopidogrel  (PLAVIX ) 75 MG tablet Take 1 tablet (75 mg total) by mouth daily. 90 tablet 2   famotidine  (PEPCID ) 20 MG tablet Take 1 tablet (20 mg total) by mouth daily. 90 tablet 3   fluticasone  (FLONASE ) 50 MCG/ACT nasal spray Place 2 sprays into both nostrils daily. 48 g 3   levothyroxine  (SYNTHROID ) 25 MCG tablet Take 1 tablet (25 mcg total) by mouth daily. 90 tablet 2   rosuvastatin  (CRESTOR ) 5 MG tablet Take 1 tablet (5 mg total) by mouth daily. 90 tablet 1   No current facility-administered medications on file prior to visit.   Past Medical History:  Diagnosis Date   Atrophy of thyroid     Cerebrovascular accident (CVA) (HCC)    Essential hypertension    GERD (gastroesophageal reflux disease)    History of skin cancer    Ischemic optic neuropathy of left eye    Low back pain    Mixed hyperlipidemia    Osteoarthritis    Stroke (HCC)    Vitamin D deficiency    Past Surgical History:  Procedure Laterality Date   ABDOMINAL HYSTERECTOMY     SALPINGOOPHORECTOMY     THYROIDECTOMY      Family History  Problem Relation Age of Onset   Hypertension Mother    Heart failure Mother    Heart failure Father    Cancer Sister        LUNG   Heart failure Brother    Hypertension Brother    Cancer Brother    Social History   Socioeconomic History   Marital status: Widowed    Spouse name: Not on file   Number of children: Not on file   Years of education: Not on file   Highest education level: Not on file  Occupational History   Not on file  Tobacco Use   Smoking status: Never   Smokeless tobacco: Never  Substance and Sexual Activity   Alcohol use: Never   Drug use: Never   Sexual activity: Not Currently  Other Topics Concern   Not on file   Social History Narrative   Not on file   Social Drivers of Health   Financial Resource Strain: Low Risk  (03/29/2023)   Overall Financial Resource Strain (CARDIA)    Difficulty of Paying Living Expenses: Not very hard  Food Insecurity: No Food Insecurity (04/04/2024)   Hunger Vital Sign    Worried About Running Out of Food in the Last Year: Never true    Ran Out of Food in the Last Year: Never true  Transportation Needs: No Transportation Needs (04/04/2024)   PRAPARE - Administrator, Civil Service (Medical): No    Lack of Transportation (  Non-Medical): No  Physical Activity: Insufficiently Active (03/29/2023)   Exercise Vital Sign    Days of Exercise per Week: 5 days    Minutes of Exercise per Session: 20 min  Stress: No Stress Concern Present (03/29/2023)   [employer] of Occupational Health - Occupational Stress Questionnaire    Feeling of Stress : Only a little  Social Connections: Moderately Integrated (03/29/2023)   Social Connection and Isolation Panel    Frequency of Communication with Friends and Family: More than three times a week    Frequency of Social Gatherings with Friends and Family: More than three times a week    Attends Religious Services: More than 4 times per year    Active Member of [employer] or Organizations: Yes    Attends Banker Meetings: More than 4 times per year    Marital Status: Widowed    Objective:  BP 136/62   Pulse 79   Temp 98 F (36.7 C)   Ht 4' 11 (1.499 m)   Wt 123 lb (55.8 kg)   SpO2 97%   BMI 24.84 kg/m      04/04/2024   10:06 AM 10/04/2023   10:16 AM 06/29/2023    4:40 PM  BP/Weight  Systolic BP 136 128 142  Diastolic BP 62 64 60  Wt. (Lbs) 123 122   BMI 24.84 kg/m2 24.64 kg/m2     Physical Exam  {Perform Simple Foot Exam  Perform Detailed exam:1} {Insert foot Exam (Optional):30965}   Lab Results  Component Value Date   WBC 5.0 10/04/2023   HGB 12.4 10/04/2023   HCT 37.2 10/04/2023   PLT  230 10/04/2023   GLUCOSE 94 10/04/2023   CHOL 190 10/04/2023   TRIG 95 10/04/2023   HDL 93 10/04/2023   LDLCALC 80 10/04/2023   ALT 14 10/04/2023   AST 20 10/04/2023   NA 143 10/04/2023   K 4.0 10/04/2023   CL 108 (H) 10/04/2023   CREATININE 1.20 (H) 10/04/2023   BUN 18 10/04/2023   CO2 20 10/04/2023   TSH 2.630 10/04/2023      Assessment & Plan:  Mixed hyperlipidemia Assessment & Plan: The current medical regimen is effective;  continue present plan and medications. Continue crestor  5 mg daily Check labs.  Orders: -     Lipid panel  Essential hypertension Assessment & Plan: Well controlled.  No changes to medicines. Continue amlodipine  2.5 mg daily. Continue to work on eating a healthy diet and exercise.  Labs drawn today.     Orders: -     CBC with Differential/Platelet -     Comprehensive metabolic panel with GFR  Atherosclerosis of aorta (HCC) Assessment & Plan: Continue crestor  5 mg daily and plavix  75 mg once daily.   Gastroesophageal reflux disease without esophagitis Assessment & Plan: The current medical regimen is effective;  continue present plan and medications.  Pepcid  20 mg once a day as needed.   PSVT (paroxysmal supraventricular tachycardia) (HCC) Assessment & Plan: The current medical regimen is effective;  continue present plan and medications. Continue diltiazem  120 mg  Daily.     Secondary hypothyroidism Assessment & Plan: Previously well controlled Continue Synthroid  at current dose     Esophageal dysphagia Assessment & Plan: Referring to GI  Orders: -     Ambulatory referral to Gastroenterology  Other orders -     dilTIAZem  HCl; Take 1 tablet (30 mg total) by mouth 2 (two) times daily as needed (PALPITATIONS).  Dispense: 60  tablet; Refill: 0    Assessment and Plan    Chronic cough Chronic cough with thick mucus and difficulty swallowing large pills. - Discuss with pharmacist for smaller Mucinex pill  options.  Dysphagia Dysphagia improved with standing. - Refer to Dr. Nicolina for esophageal dilation.  Gastroesophageal reflux disease GERD managed with as-needed medication. - Continue as-needed reflux medication.  Peripheral edema Intermittent peripheral edema likely due to poor circulation. - Advise elevating feet to manage swelling.  Chronic kidney disease Chronic kidney disease with mild dysfunction.  Stroke Managed with Plavix . - Continue Plavix  as prescribed.  Hypertension Hypertension managed with amlodipine . - Continue amlodipine  as prescribed.  Hyperlipidemia Hyperlipidemia managed with Crestor . Recent cholesterol levels good. - Continue Crestor  as prescribed.  Hypothyroidism, post-thyroidectomy Post-thyroidectomy hypothyroidism managed with thyroid  medication. Occasional neck swelling. - Continue thyroid  medication as prescribed.  Osteoarthritis Osteoarthritis managed with Celebrex . - Continue Celebrex  as prescribed.  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 Avoids nasal sprays.  Recording duration: 13 minutes        Meds ordered this encounter  Medications   diltiazem  (CARDIZEM ) 30 MG tablet    Sig: Take 1 tablet (30 mg total) by mouth 2 (two) times daily as needed (PALPITATIONS).    Dispense:  60 tablet    Refill:  0    Orders Placed This Encounter  Procedures   CBC with Differential/Platelet   Comprehensive metabolic panel with GFR   Lipid panel   Ambulatory referral to Gastroenterology     Follow-up: Return in about 6 months (around 10/05/2024). I,Marla I Leal-Borjas,acting as a scribe for Abigail Free, MD.,have documented all relevant documentation on the behalf of Abigail Free, MD,as directed by  Abigail Free, MD while in the presence of Abigail Free, MD.    An After Visit Summary was printed and given to the patient.  Abigail Free, MD [facility] [phone]

## 2024-04-04 NOTE — Assessment & Plan Note (Signed)
 The current medical regimen is effective;  continue present plan and medications.  Pepcid 20 mg once a day as needed.

## 2024-04-04 NOTE — Assessment & Plan Note (Signed)
 The current medical regimen is effective;  continue present plan and medications. Continue crestor 5 mg daily Check labs.

## 2024-04-04 NOTE — Assessment & Plan Note (Signed)
 Continue crestor 5 mg daily and plavix 75 mg once daily.

## 2024-04-04 NOTE — Assessment & Plan Note (Signed)
 The current medical regimen is effective;  continue present plan and medications. Continue diltiazem 120 mg  Daily.

## 2024-04-04 NOTE — Assessment & Plan Note (Signed)
Referring to GI.

## 2024-04-04 NOTE — Assessment & Plan Note (Signed)
 Well controlled.  No changes to medicines. Continue amlodipine 2.5 mg daily. Continue to work on eating a healthy diet and exercise.  Labs drawn today.

## 2024-04-05 ENCOUNTER — Ambulatory Visit: Payer: Self-pay | Admitting: Family Medicine

## 2024-04-05 LAB — LIPID PANEL
Chol/HDL Ratio: 2.1 ratio (ref 0.0–4.4)
Cholesterol, Total: 190 mg/dL (ref 100–199)
HDL: 92 mg/dL (ref >39)
LDL Chol Calc (NIH): 82 mg/dL (ref 0–99)
Triglycerides: 90 mg/dL (ref 0–149)
VLDL Cholesterol Cal: 16 mg/dL (ref 5–40)

## 2024-04-05 LAB — CBC WITH DIFFERENTIAL/PLATELET
Basophils Absolute: 0 x10E3/uL (ref 0.0–0.2)
Basos: 1 %
EOS (ABSOLUTE): 0.6 x10E3/uL — ABNORMAL HIGH (ref 0.0–0.4)
Eos: 11 %
Hematocrit: 37.5 % (ref 34.0–46.6)
Hemoglobin: 11.8 g/dL (ref 11.1–15.9)
Immature Grans (Abs): 0 x10E3/uL (ref 0.0–0.1)
Immature Granulocytes: 0 %
Lymphocytes Absolute: 1.6 x10E3/uL (ref 0.7–3.1)
Lymphs: 29 %
MCH: 29.7 pg (ref 26.6–33.0)
MCHC: 31.5 g/dL (ref 31.5–35.7)
MCV: 95 fL (ref 79–97)
Monocytes Absolute: 0.6 x10E3/uL (ref 0.1–0.9)
Monocytes: 10 %
Neutrophils Absolute: 2.9 x10E3/uL (ref 1.4–7.0)
Neutrophils: 49 %
Platelets: 220 x10E3/uL (ref 150–450)
RBC: 3.97 x10E6/uL (ref 3.77–5.28)
RDW: 12.2 % (ref 11.7–15.4)
WBC: 5.7 x10E3/uL (ref 3.4–10.8)

## 2024-04-05 LAB — COMPREHENSIVE METABOLIC PANEL WITH GFR
ALT: 11 IU/L (ref 0–32)
AST: 21 IU/L (ref 0–40)
Albumin: 4.2 g/dL (ref 3.6–4.6)
Alkaline Phosphatase: 78 IU/L (ref 44–121)
BUN/Creatinine Ratio: 16 (ref 12–28)
BUN: 22 mg/dL (ref 10–36)
Bilirubin Total: 0.5 mg/dL (ref 0.0–1.2)
CO2: 19 mmol/L — ABNORMAL LOW (ref 20–29)
Calcium: 9.6 mg/dL (ref 8.7–10.3)
Chloride: 106 mmol/L (ref 96–106)
Creatinine, Ser: 1.34 mg/dL — ABNORMAL HIGH (ref 0.57–1.00)
Globulin, Total: 2.6 g/dL (ref 1.5–4.5)
Glucose: 90 mg/dL (ref 70–99)
Potassium: 4.1 mmol/L (ref 3.5–5.2)
Sodium: 142 mmol/L (ref 134–144)
Total Protein: 6.8 g/dL (ref 6.0–8.5)
eGFR: 37 mL/min/1.73 — ABNORMAL LOW (ref >59)

## 2024-04-19 ENCOUNTER — Ambulatory Visit: Payer: Self-pay | Admitting: Family Medicine

## 2024-04-19 NOTE — Telephone Encounter (Signed)
 FYI Only or Action Required?: Action required by provider: medication refill request.  Patient was last seen in primary care on 04/04/2024 by Sherre Clapper, MD.  Called Nurse Triage reporting Sore Throat.  Symptoms began several days ago.  Symptoms are: unchanged.  Triage Disposition: See Physician Within 24 Hours               Patient/caregiver understands and will follow disposition?: Yes Reason for Disposition  SEVERE throat pain (e.g., excruciating)  Answer Assessment - Initial Assessment Questions Pt states she is legally blind and has no form of transportation. Pt is requesting a medication for her sore throat. Pt states she normally gets a Kenalog  shot from Dr. Sherre every year when she gets this. Pt also states she does not want to be around anyone. Pt call back number is [phone].  Pharmacy is: [facility], [place] - [address] [place], [place] [address] Phone: [phone]  Fax: [phone]    ONSET: When did the throat start hurting? (Hours or days ago)      Sunday after sitting under cold air in church SEVERITY: How bad is the sore throat? (Scale 1-10; mild, moderate or severe)     8/10 pain level VIRAL SYMPTOMS: Are there any symptoms of a cold, such as a runny nose, cough, hoarse voice or red eyes?      Denies FEVER: Do you have a fever? If Yes, ask: What is your temperature, how was it measured, and when did it start?     No OTHER SYMPTOMS: Do you have any other symptoms? (e.g., difficulty breathing, headache, rash)     Denies difficulty breathing or swallowing  Protocols used: Sore Throat-A-AH

## 2024-04-20 ENCOUNTER — Ambulatory Visit

## 2024-04-20 NOTE — Telephone Encounter (Signed)
 Left voicemail to her daughter to call us  back.

## 2024-04-20 NOTE — Telephone Encounter (Signed)
 Called patient and try to make an appointment. I left voicemail to call us  back.

## 2024-04-21 ENCOUNTER — Ambulatory Visit: Payer: Self-pay | Admitting: Family Medicine

## 2024-04-21 ENCOUNTER — Ambulatory Visit (INDEPENDENT_AMBULATORY_CARE_PROVIDER_SITE_OTHER)
Admission: RE | Admit: 2024-04-21 | Discharge: 2024-04-21 | Disposition: A | Discharge status: Home / Self Care | Source: Ambulatory Visit | Attending: Family Medicine | Admitting: Family Medicine

## 2024-04-21 ENCOUNTER — Ambulatory Visit (INDEPENDENT_AMBULATORY_CARE_PROVIDER_SITE_OTHER): Admitting: Family Medicine

## 2024-04-21 ENCOUNTER — Encounter: Payer: Self-pay | Admitting: Family Medicine

## 2024-04-21 VITALS — BP 132/68 | HR 80 | Temp 98.0°F | Resp 18 | Ht 59.0 in | Wt 121.0 lb

## 2024-04-21 DIAGNOSIS — J984 Other disorders of lung: Secondary | ICD-10-CM | POA: Diagnosis not present

## 2024-04-21 DIAGNOSIS — R0989 Other specified symptoms and signs involving the circulatory and respiratory systems: Secondary | ICD-10-CM | POA: Insufficient documentation

## 2024-04-21 DIAGNOSIS — J029 Acute pharyngitis, unspecified: Secondary | ICD-10-CM | POA: Insufficient documentation

## 2024-04-21 LAB — POCT RAPID STREP A (OFFICE): Rapid Strep A Screen: NEGATIVE

## 2024-04-21 MED ORDER — TRIAMCINOLONE ACETONIDE 40 MG/ML IJ SUSP
40.0000 mg | Freq: Once | INTRAMUSCULAR | Status: AC
Start: 2024-04-21 — End: 2024-04-21
  Administered 2024-04-21: 40 mg via INTRAMUSCULAR

## 2024-04-21 NOTE — Assessment & Plan Note (Signed)
 Acute upper respiratory infection with cough, sore throat, and possible pneumonia 89 year old female with symptoms suggesting possible pneumonia and differential diagnosis of strep throat. - Administered Kenalog  shot. - Performed strep test. - Ordered chest x-ray at [facility] to rule out pneumonia. - If strep test positive, start antibiotics for strep throat. - If chest x-ray indicates pneumonia, initiate treatment with two antibiotics for broader spectrum coverage based on current guidelines.

## 2024-04-21 NOTE — Progress Notes (Signed)
 Acute Office Visit  Subjective:    Patient ID: Elizabeth Bradley, female    DOB: [birth year], 89 y.o.   MRN: [id number]  Chief Complaint  Patient presents with   Cough    Discussed the use of AI scribe software for clinical note transcription with the patient, who gave verbal consent to proceed.  History of Present Illness   Elizabeth Bradley is a 89 year old female with a history of strokes who presents with a persistent cough and sore throat.  She has been experiencing a persistent cough for the past week, with the worst symptoms occurring on Tuesday. The cough is centered in her throat, which she attributes to her tonsils. Her throat was swollen, making it difficult to swallow, and she went two days without eating. She has a history of experiencing similar symptoms annually, often triggered by exposure to wind.  For her current symptoms, she has used a home remedy of whiskey and honey, which she finds effective. She has not taken any over-the-counter medications due to a history of strokes and a preference to only use prescribed medications. She mentions a past treatment with a Kenalog  shot at urgent care, which she has not received in the last two to four years.  She is allergic to oral prednisone , which causes adverse reactions, but she tolerates steroid injections well. She also mentions a family history of similar symptoms, noting that her grandson experiences similar issues.  During the review of symptoms, she reports a sore throat, difficulty swallowing, and a history of ear pain on one side. She also mentions experiencing some congestion.  She is legally blind and has difficulty with phone systems and video calls.       Past Medical History:  Diagnosis Date   Atrophy of thyroid     Cerebrovascular accident (CVA) (HCC)    Essential hypertension    GERD (gastroesophageal reflux disease)    History of skin cancer    Ischemic optic neuropathy of left eye    Low back pain     Mixed hyperlipidemia    Osteoarthritis    Stroke (HCC)    Vitamin D deficiency     Past Surgical History:  Procedure Laterality Date   ABDOMINAL HYSTERECTOMY     SALPINGOOPHORECTOMY     THYROIDECTOMY      Family History  Problem Relation Age of Onset   Hypertension Mother    Heart failure Mother    Heart failure Father    Cancer Sister        LUNG   Heart failure Brother    Hypertension Brother    Cancer Brother     Social History   Socioeconomic History   Marital status: Widowed    Spouse name: Not on file   Number of children: Not on file   Years of education: Not on file   Highest education level: Not on file  Occupational History   Not on file  Tobacco Use   Smoking status: Never   Smokeless tobacco: Never  Substance and Sexual Activity   Alcohol use: Never   Drug use: Never   Sexual activity: Not Currently  Other Topics Concern   Not on file  Social History Narrative   Not on file   Social Drivers of Health   Financial Resource Strain: Low Risk  (03/29/2023)   Overall Financial Resource Strain (CARDIA)    Difficulty of Paying Living Expenses: Not very hard  Food Insecurity: No Food Insecurity (04/04/2024)  Hunger Vital Sign    Worried About Running Out of Food in the Last Year: Never true    Ran Out of Food in the Last Year: Never true  Transportation Needs: No Transportation Needs (04/04/2024)   PRAPARE - Administrator, Civil Service (Medical): No    Lack of Transportation (Non-Medical): No  Physical Activity: Insufficiently Active (03/29/2023)   Exercise Vital Sign    Days of Exercise per Week: 5 days    Minutes of Exercise per Session: 20 min  Stress: No Stress Concern Present (03/29/2023)   [employer] of Occupational Health - Occupational Stress Questionnaire    Feeling of Stress : Only a little  Social Connections: Moderately Integrated (03/29/2023)   Social Connection and Isolation Panel    Frequency of Communication with  Friends and Family: More than three times a week    Frequency of Social Gatherings with Friends and Family: More than three times a week    Attends Religious Services: More than 4 times per year    Active Member of [employer] or Organizations: Yes    Attends Banker Meetings: More than 4 times per year    Marital Status: Widowed  Intimate Partner Violence: Not At Risk (04/04/2024)   Humiliation, Afraid, Rape, and Kick questionnaire    Fear of Current or Ex-Partner: No    Emotionally Abused: No    Physically Abused: No    Sexually Abused: No    Outpatient Medications Prior to Visit  Medication Sig Dispense Refill   amLODipine  (NORVASC ) 2.5 MG tablet Take 1 tablet (2.5 mg total) by mouth daily. 90 tablet 2   benzonatate  (TESSALON ) 200 MG capsule Take 1 capsule (200 mg total) by mouth 3 (three) times daily as needed for cough. 60 capsule 0   celecoxib  (CELEBREX ) 100 MG capsule Take 1 capsule (100 mg total) by mouth daily. (Patient taking differently: Take 100 mg by mouth daily. As needed) 90 capsule 1   cetirizine (ZYRTEC) 10 MG chewable tablet Chew 10 mg by mouth daily. (Patient taking differently: Chew 10 mg by mouth daily as needed.)     Cholecalciferol (VITAMIN D-3) 25 MCG (1000 UT) CAPS Take by mouth.     clopidogrel  (PLAVIX ) 75 MG tablet Take 1 tablet (75 mg total) by mouth daily. 90 tablet 2   diltiazem  (CARDIZEM ) 30 MG tablet Take 1 tablet (30 mg total) by mouth 2 (two) times daily as needed (PALPITATIONS). 60 tablet 0   famotidine  (PEPCID ) 20 MG tablet Take 1 tablet (20 mg total) by mouth daily. (Patient taking differently: Take 20 mg by mouth daily. As needed) 90 tablet 3   levothyroxine  (SYNTHROID ) 25 MCG tablet Take 1 tablet (25 mcg total) by mouth daily. 90 tablet 2   rosuvastatin  (CRESTOR ) 5 MG tablet Take 1 tablet (5 mg total) by mouth daily. 90 tablet 1   fluticasone  (FLONASE ) 50 MCG/ACT nasal spray Place 2 sprays into both nostrils daily. 48 g 3   No  facility-administered medications prior to visit.    Allergies  Allergen Reactions   Simvastatin  Other (See Comments)    Muscle Cramps   Adhesive [Tape]    Iodinated Contrast Media Hives and Other (See Comments)    Sneezing and hives 09/09/2019; needs 13 hr prep   Penicillins    Prednisone      Alopecia     Review of Systems  Constitutional:  Negative for chills, diaphoresis, fatigue and fever.  HENT:  Positive for ear pain (left)  and sore throat (started Tuesday). Negative for congestion and sinus pain.   Respiratory:  Positive for cough and shortness of breath. Negative for wheezing.   Cardiovascular:  Negative for chest pain.  Gastrointestinal:  Negative for abdominal pain, constipation, nausea and vomiting.  Genitourinary:  Negative for dysuria.  Musculoskeletal:  Negative for arthralgias.  Neurological:  Negative for weakness and headaches.  Psychiatric/Behavioral:  Negative for dysphoric mood. The patient is not nervous/anxious.        Objective:        04/21/2024   10:50 AM 04/04/2024   10:06 AM 10/04/2023   10:16 AM  Vitals with BMI  Height 4' 11 4' 11 4' 11  Weight 121 lbs 123 lbs 122 lbs  BMI 24.43 24.83 24.63  Systolic 132 136 871  Diastolic 68 62 64  Pulse 80 79 90    No data found.   Physical Exam Vitals reviewed.  Constitutional:      General: She is not in acute distress.    Appearance: Normal appearance. She is not ill-appearing.  HENT:     Right Ear: Tympanic membrane, ear canal and external ear normal.     Left Ear: Tympanic membrane is erythematous.     Nose:     Right Sinus: No maxillary sinus tenderness or frontal sinus tenderness.     Left Sinus: No maxillary sinus tenderness or frontal sinus tenderness.     Mouth/Throat:     Pharynx: Posterior oropharyngeal erythema present. No oropharyngeal exudate.  Eyes:     Conjunctiva/sclera: Conjunctivae normal.  Cardiovascular:     Rate and Rhythm: Normal rate and regular rhythm.     Heart  sounds: Normal heart sounds. No murmur heard. Pulmonary:     Effort: Pulmonary effort is normal.     Breath sounds: Normal breath sounds. No wheezing.  Abdominal:     Palpations: Abdomen is soft.  Neurological:     Mental Status: She is alert. Mental status is at baseline.  Psychiatric:        Mood and Affect: Mood normal.        Behavior: Behavior normal.     Health Maintenance Due  Topic Date Due   DTaP/Tdap/Td (1 - Tdap) Never done   Zoster Vaccines- Shingrix (2 of 2) 07/21/2017   Medicare Annual Wellness (AWV)  03/28/2024   INFLUENZA VACCINE  04/07/2024    There are no preventive care reminders to display for this patient.   Lab Results  Component Value Date   TSH 2.630 10/04/2023   Lab Results  Component Value Date   WBC 5.7 04/04/2024   HGB 11.8 04/04/2024   HCT 37.5 04/04/2024   MCV 95 04/04/2024   PLT 220 04/04/2024   Lab Results  Component Value Date   NA 142 04/04/2024   K 4.1 04/04/2024   CO2 19 (L) 04/04/2024   GLUCOSE 90 04/04/2024   BUN 22 04/04/2024   CREATININE 1.34 (H) 04/04/2024   BILITOT 0.5 04/04/2024   ALKPHOS 78 04/04/2024   AST 21 04/04/2024   ALT 11 04/04/2024   PROT 6.8 04/04/2024   ALBUMIN 4.2 04/04/2024   CALCIUM  9.6 04/04/2024   ANIONGAP 10 09/09/2019   EGFR 37 (L) 04/04/2024   Lab Results  Component Value Date   CHOL 190 04/04/2024   Lab Results  Component Value Date   HDL 92 04/04/2024   Lab Results  Component Value Date   LDLCALC 82 04/04/2024   Lab Results  Component Value Date  TRIG 90 04/04/2024   Lab Results  Component Value Date   CHOLHDL 2.1 04/04/2024   No results found for: HGBA1C     Assessment & Plan:  Pharyngitis, unspecified etiology Assessment & Plan: Strep test - negative - Try honey and lemon for sore throat - Gargle with warm salt water - Try hot tea to soothe the throat   Orders: -     POCT rapid strep A  Symptoms of upper respiratory infection (URI) Assessment & Plan: Acute  upper respiratory infection with cough, sore throat, and possible pneumonia 89 year old female with symptoms suggesting possible pneumonia and differential diagnosis of strep throat. - Administered Kenalog  shot. - Performed strep test. - Ordered chest x-ray at [facility] to rule out pneumonia. - If strep test positive, start antibiotics for strep throat. - If chest x-ray indicates pneumonia, initiate treatment with two antibiotics for broader spectrum coverage based on current guidelines.  Orders: -     Triamcinolone  Acetonide -     DG Chest 2 View; Future     Meds ordered this encounter  Medications   triamcinolone  acetonide (KENALOG -40) injection 40 mg    Orders Placed This Encounter  Procedures   DG Chest 2 View   Rapid Strep A        Follow-up: No follow-ups on file.  An After Visit Summary was printed and given to the patient.  Harrie Cedar, FNP [facility] [phone]

## 2024-04-21 NOTE — Assessment & Plan Note (Signed)
 Strep test - negative - Try honey and lemon for sore throat - Gargle with warm salt water - Try hot tea to soothe the throat

## 2024-07-06 ENCOUNTER — Other Ambulatory Visit: Payer: Self-pay | Admitting: Family Medicine

## 2024-07-06 DIAGNOSIS — G8929 Other chronic pain: Secondary | ICD-10-CM

## 2024-07-14 DIAGNOSIS — L570 Actinic keratosis: Secondary | ICD-10-CM | POA: Diagnosis not present

## 2024-07-14 DIAGNOSIS — L700 Acne vulgaris: Secondary | ICD-10-CM | POA: Diagnosis not present

## 2024-07-14 DIAGNOSIS — L821 Other seborrheic keratosis: Secondary | ICD-10-CM | POA: Diagnosis not present

## 2024-07-17 DIAGNOSIS — K219 Gastro-esophageal reflux disease without esophagitis: Secondary | ICD-10-CM | POA: Diagnosis not present

## 2024-07-28 DIAGNOSIS — I471 Supraventricular tachycardia, unspecified: Secondary | ICD-10-CM | POA: Diagnosis not present

## 2024-07-28 DIAGNOSIS — I358 Other nonrheumatic aortic valve disorders: Secondary | ICD-10-CM | POA: Diagnosis not present

## 2024-07-28 DIAGNOSIS — R002 Palpitations: Secondary | ICD-10-CM | POA: Diagnosis not present

## 2024-08-01 DIAGNOSIS — H353131 Nonexudative age-related macular degeneration, bilateral, early dry stage: Secondary | ICD-10-CM | POA: Diagnosis not present

## 2024-09-12 ENCOUNTER — Other Ambulatory Visit: Payer: Self-pay | Admitting: Family Medicine

## 2024-10-04 ENCOUNTER — Other Ambulatory Visit: Payer: Self-pay | Admitting: Family Medicine

## 2024-10-04 DIAGNOSIS — E038 Other specified hypothyroidism: Secondary | ICD-10-CM

## 2024-10-04 DIAGNOSIS — E782 Mixed hyperlipidemia: Secondary | ICD-10-CM

## 2024-10-04 DIAGNOSIS — I700 Atherosclerosis of aorta: Secondary | ICD-10-CM

## 2024-10-05 ENCOUNTER — Ambulatory Visit: Admitting: Family Medicine

## 2024-10-12 ENCOUNTER — Other Ambulatory Visit: Payer: Self-pay | Admitting: Family Medicine

## 2024-12-12 ENCOUNTER — Ambulatory Visit: Admitting: Family Medicine
